# Patient Record
Sex: Female | Born: 1940 | Race: Black or African American | Hispanic: No | Smoking: Never smoker
Health system: Southern US, Community
[De-identification: ages and names within clinical notes are randomized; demographics above are authoritative.]

## PROBLEM LIST (undated history)

## (undated) DIAGNOSIS — J449 Chronic obstructive pulmonary disease, unspecified: Secondary | ICD-10-CM

## (undated) DIAGNOSIS — E119 Type 2 diabetes mellitus without complications: Secondary | ICD-10-CM

## (undated) DIAGNOSIS — J45909 Unspecified asthma, uncomplicated: Secondary | ICD-10-CM

## (undated) DIAGNOSIS — I509 Heart failure, unspecified: Secondary | ICD-10-CM

## (undated) DIAGNOSIS — K56609 Unspecified intestinal obstruction, unspecified as to partial versus complete obstruction: Secondary | ICD-10-CM

## (undated) DIAGNOSIS — K5792 Diverticulitis of intestine, part unspecified, without perforation or abscess without bleeding: Secondary | ICD-10-CM

## (undated) DIAGNOSIS — D649 Anemia, unspecified: Secondary | ICD-10-CM

## (undated) HISTORY — PX: CHOLECYSTECTOMY: SHX55

## (undated) HISTORY — PX: ABDOMINAL HYSTERECTOMY: SHX81

---

## 1999-04-26 ENCOUNTER — Emergency Department (HOSPITAL_COMMUNITY): Admission: EM | Admit: 1999-04-26 | Discharge: 1999-04-26 | Payer: Self-pay | Admitting: Emergency Medicine

## 2006-05-10 ENCOUNTER — Emergency Department (HOSPITAL_COMMUNITY): Admission: EM | Admit: 2006-05-10 | Discharge: 2006-05-10 | Payer: Self-pay | Admitting: Emergency Medicine

## 2006-05-28 ENCOUNTER — Emergency Department (HOSPITAL_COMMUNITY): Admission: EM | Admit: 2006-05-28 | Discharge: 2006-05-28 | Payer: Self-pay | Admitting: Emergency Medicine
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
## 2011-07-11 ENCOUNTER — Emergency Department (HOSPITAL_COMMUNITY): Payer: Medicare (Managed Care)

## 2011-07-11 ENCOUNTER — Inpatient Hospital Stay (INDEPENDENT_AMBULATORY_CARE_PROVIDER_SITE_OTHER)
Admission: RE | Admit: 2011-07-11 | Discharge: 2011-07-11 | Disposition: A | Discharge status: Home / Self Care | Payer: Medicare (Managed Care) | Source: Ambulatory Visit | Attending: Emergency Medicine | Admitting: Emergency Medicine

## 2011-07-11 ENCOUNTER — Inpatient Hospital Stay (HOSPITAL_COMMUNITY)
Admission: EM | Admit: 2011-07-11 | Discharge: 2011-07-13 | DRG: 287 | Disposition: A | Discharge status: Home / Self Care | Payer: Medicare (Managed Care) | Attending: Family Medicine | Admitting: Family Medicine

## 2011-07-11 DIAGNOSIS — I200 Unstable angina: Principal | ICD-10-CM | POA: Diagnosis present

## 2011-07-11 DIAGNOSIS — I509 Heart failure, unspecified: Secondary | ICD-10-CM | POA: Diagnosis present

## 2011-07-11 DIAGNOSIS — K219 Gastro-esophageal reflux disease without esophagitis: Secondary | ICD-10-CM | POA: Diagnosis present

## 2011-07-11 DIAGNOSIS — R0989 Other specified symptoms and signs involving the circulatory and respiratory systems: Secondary | ICD-10-CM

## 2011-07-11 DIAGNOSIS — Z79899 Other long term (current) drug therapy: Secondary | ICD-10-CM

## 2011-07-11 DIAGNOSIS — I10 Essential (primary) hypertension: Secondary | ICD-10-CM

## 2011-07-11 DIAGNOSIS — E119 Type 2 diabetes mellitus without complications: Secondary | ICD-10-CM | POA: Diagnosis present

## 2011-07-11 DIAGNOSIS — Z7982 Long term (current) use of aspirin: Secondary | ICD-10-CM

## 2011-07-11 DIAGNOSIS — I4891 Unspecified atrial fibrillation: Secondary | ICD-10-CM | POA: Diagnosis present

## 2011-07-11 DIAGNOSIS — R0609 Other forms of dyspnea: Secondary | ICD-10-CM

## 2011-07-11 DIAGNOSIS — I252 Old myocardial infarction: Secondary | ICD-10-CM

## 2011-07-11 DIAGNOSIS — I5032 Chronic diastolic (congestive) heart failure: Secondary | ICD-10-CM | POA: Diagnosis present

## 2011-07-11 DIAGNOSIS — J45909 Unspecified asthma, uncomplicated: Secondary | ICD-10-CM

## 2011-07-11 DIAGNOSIS — I498 Other specified cardiac arrhythmias: Secondary | ICD-10-CM | POA: Diagnosis not present

## 2011-07-11 DIAGNOSIS — D649 Anemia, unspecified: Secondary | ICD-10-CM | POA: Diagnosis present

## 2011-07-11 DIAGNOSIS — R079 Chest pain, unspecified: Secondary | ICD-10-CM

## 2011-07-11 DIAGNOSIS — R0789 Other chest pain: Secondary | ICD-10-CM

## 2011-07-11 LAB — CBC
HCT: 33.3 % — ABNORMAL LOW (ref 36.0–46.0)
Hemoglobin: 10.8 g/dL — ABNORMAL LOW (ref 12.0–15.0)
MCH: 24.2 pg — ABNORMAL LOW (ref 26.0–34.0)
MCV: 74.7 fL — ABNORMAL LOW (ref 78.0–100.0)
Platelets: 213 10*3/uL (ref 150–400)
RBC: 4.46 MIL/uL (ref 3.87–5.11)
WBC: 5.9 10*3/uL (ref 4.0–10.5)

## 2011-07-11 LAB — BASIC METABOLIC PANEL
BUN: 9 mg/dL (ref 6–23)
Potassium: 3.7 mEq/L (ref 3.5–5.1)
Sodium: 143 mEq/L (ref 135–145)

## 2011-07-11 LAB — DIFFERENTIAL
Eosinophils Absolute: 0.1 10*3/uL (ref 0.0–0.7)
Lymphs Abs: 2.3 10*3/uL (ref 0.7–4.0)
Monocytes Absolute: 0.4 10*3/uL (ref 0.1–1.0)
Neutro Abs: 3.1 10*3/uL (ref 1.7–7.7)

## 2011-07-11 LAB — GLUCOSE, CAPILLARY: Glucose-Capillary: 166 mg/dL — ABNORMAL HIGH (ref 70–99)

## 2011-07-11 LAB — POCT I-STAT TROPONIN I

## 2011-07-11 NOTE — H&P (Signed)
Family Medicine Teaching [facility] Admission History and Physical  Patient name: Dominique Hurst Medical record number: [id number] Date of birth: 07/16/1941 Age: 70 y.o. Gender: female  Primary Care Provider: No primary provider on file.  Chief Complaint: Chest tightness and SOB History of Present Illness: Dominique Hurst is a 70 y.o. year old female presenting with 2 day history of SOB and chest tightness. Pt has been in [place] for 2 weeks visiting family. Pt has experienced SOB and chest tightness whenever going upstairs to the second floor while here. Symptoms usually resolve within minutes and never include CP, HA, and diaphoresis. Onset of SOB and chest tightness for the past two days has been fairly persistent and has not been relieved entirely by rest. The pt has tried SL nitro w/ some relief of symptoms. The tightness is primarily substernal with radiation to the neck. Denies any history of heart burn or reflux.  Pt regularly seen by cardiologist in [place]. She had a cardiac catheterization performed within the last few years, but no stenting or angioplasty was performed. Pt has been managed medically. Pt initially prescribed Pradaxa but did not tolerate due to GI symptoms and bleeding. Her last appointment with her cardiologist was two months ago.   ED Course: Pt was seen in the ED and given SL nitro and placed on oxygen w/ some relief of symptoms. EKG showed NSR, L axis deviation, and non-specific T-wave abnormality. CXR showed were done which showed an enlarged heart with vascular congestion and basilar atelectasis. No definite CHF, pneumonia, collapse, consolidation, effusion or pneumothorax. Troponin in the ED was normal.    Past Medical History: Asthma HTN CAD Diverticulitis DM (non-insulin dependent) Anemia  Past Surgical History: Hysterectomy Hernia Repair Cholecystectomy  Social History: Lives at home with granddaughter. 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 Does have some dizziness when going from lying to standing, and does have occasional HA. Otherwise 12 point review of systems was performed and was unremarkable.  Physical Exam: Pulse: 76  Blood Pressure: 129/70 RR: 19   O2: 98 on RA Temp: 98.2  General: alert, cooperative and appears stated age HEENT: extra ocular movement intact, neck supple with midline trachea, thyroid without masses and trachea midline Heart: S1, S2 normal, no murmur, rub or gallop, regular rate and rhythm Lungs: clear to auscultation, no wheezes or rales and unlabored breathing Abdomen: abdomen is soft without significant tenderness, masses, organomegaly or guarding Extremities: extremities normal, atraumatic, no cyanosis or edema Musculoskeletal: no joint tenderness, deformity or swelling, no muscular tenderness noted Skin:no rashes, no ecchymoses Neurology: normal without focal findings and mental status, speech normal, alert and oriented x3  Labs and Imaging: Lab Results  Component Value Date/Time   NA 143 07/11/2011  3:30 PM   K 3.7 07/11/2011  3:30 PM   CL 103 07/11/2011  3:30 PM   CO2 31 07/11/2011  3:30 PM   BUN 9 07/11/2011  3:30 PM   CREATININE 0.60 07/11/2011  3:30 PM   GLUCOSE 100* 07/11/2011  3:30 PM   Lab Results  Component Value Date   WBC 5.9 07/11/2011   HGB 10.8* 07/11/2011   HCT 33.3* 07/11/2011   MCV 74.7* 07/11/2011   PLT 213 07/11/2011   Troponin 0.00     Assessment and Plan: Dominique Hurst is a 70 y.o. year old female presenting w/  chest tightness and SOB w/ h/o CAD, HTN, DM 1. CV: Possible MI but unlikely at this point due to normal CE and EKG. We will cycle CE x2 every 6 hours, place pt on telemetry overnight, repeat EKG in the morning. Symptoms improved after SL nitro and O2. Morphine 1mg  Q4hrs for pain, ASA 81, SL nitro, and low dose coreg, and O2 as needed for tonight. We will reavaluate in the morning. We will consider a cardiology consult +/- if condition worsens overnight. Will discuss possible cath for pt due to h/o CAD, risk factors, and current presentation. We will continue home medications for her HTN.  2.   DM type II: Well controlled at home per pt. Glucose 100 at time of admission. Will hold home medications of glipizide and and metformin due to possibility of being NPO for cath and contrast if indicated. Will control on SSI. 3. FEN/GI: Regular heart healthy diet. SLIV 4. Prophylaxis: SCDs for DVT prophylaxis due to GI bleed while on pradaxa 5. Disposition: Pending further workup and clinical improvement

## 2011-07-12 DIAGNOSIS — R0602 Shortness of breath: Secondary | ICD-10-CM

## 2011-07-12 DIAGNOSIS — R079 Chest pain, unspecified: Secondary | ICD-10-CM

## 2011-07-12 LAB — PHOSPHORUS: Phosphorus: 4.2 mg/dL (ref 2.3–4.6)

## 2011-07-12 LAB — COMPREHENSIVE METABOLIC PANEL
AST: 23 U/L (ref 0–37)
Alkaline Phosphatase: 72 U/L (ref 39–117)
Calcium: 10 mg/dL (ref 8.4–10.5)
GFR calc Af Amer: >60 mL/min (ref >60)
Glucose, Bld: 98 mg/dL (ref 70–99)
Potassium: 3.7 mEq/L (ref 3.5–5.1)
Sodium: 141 mEq/L (ref 135–145)
Total Protein: 7 g/dL (ref 6.0–8.3)

## 2011-07-12 LAB — CBC
HCT: 33.1 % — ABNORMAL LOW (ref 36.0–46.0)
MCH: 23.8 pg — ABNORMAL LOW (ref 26.0–34.0)
MCV: 75.1 fL — ABNORMAL LOW (ref 78.0–100.0)
Platelets: 208 10*3/uL (ref 150–400)
RBC: 4.41 MIL/uL (ref 3.87–5.11)

## 2011-07-12 LAB — PROTIME-INR
INR: 0.91 (ref 0.00–1.49)
Prothrombin Time: 12.4 seconds (ref 11.6–15.2)

## 2011-07-12 LAB — GLUCOSE, CAPILLARY
Glucose-Capillary: 140 mg/dL — ABNORMAL HIGH (ref 70–99)
Glucose-Capillary: 177 mg/dL — ABNORMAL HIGH (ref 70–99)
Glucose-Capillary: 157 mg/dL — ABNORMAL HIGH (ref 70–99)

## 2011-07-12 LAB — CARDIAC PANEL(CRET KIN+CKTOT+MB+TROPI)
Relative Index: 2.1 (ref 0.0–2.5)
Total CK: 198 U/L — ABNORMAL HIGH (ref 7–177)

## 2011-07-12 LAB — MAGNESIUM: Magnesium: 2 mg/dL (ref 1.5–2.5)

## 2011-07-12 LAB — HEMOGLOBIN A1C: Mean Plasma Glucose: 169 mg/dL — ABNORMAL HIGH (ref <117)

## 2011-07-13 DIAGNOSIS — I498 Other specified cardiac arrhythmias: Secondary | ICD-10-CM

## 2011-07-13 LAB — GLUCOSE, CAPILLARY
Glucose-Capillary: 126 mg/dL — ABNORMAL HIGH (ref 70–99)
Glucose-Capillary: 105 mg/dL — ABNORMAL HIGH (ref 70–99)
Glucose-Capillary: 86 mg/dL (ref 70–99)
Glucose-Capillary: 209 mg/dL — ABNORMAL HIGH (ref 70–99)

## 2011-07-13 LAB — LIPID PANEL
Cholesterol: 167 mg/dL (ref 0–200)
Total CHOL/HDL Ratio: 2.6 RATIO

## 2011-07-13 LAB — VITAMIN B12: Vitamin B-12: 756 pg/mL (ref 211–911)

## 2011-07-13 LAB — IRON AND TIBC: TIBC: 302 ug/dL (ref 250–470)

## 2011-07-13 LAB — FERRITIN: Ferritin: 17 ng/mL (ref 10–291)

## 2011-07-13 NOTE — Consult Note (Addendum)
NAMEMarland Hurst  Dominique, Hurst NO.:  [id number]  MEDICAL RECORD NO.:  [id number]  LOCATION:  2002                         FACILITY:  [facility]  PHYSICIAN:  Vesta Mixer, M.D. DATE OF BIRTH:  [birth year]  DATE OF CONSULTATION:  07/12/2011 DATE OF DISCHARGE:                                CONSULTATION   PRIMARY CARDIOLOGIST:  Dr. Kelby Aline,  [place], [place].  CHIEF COMPLAINT:  Shortness of breath.  REASON FOR CONSULTATION:  Shortness of breath, chest pressure, and pauses.  HISTORY OF PRESENT ILLNESS:  Dominique Hurst is a 70 year old pleasant female who is followed in [place], [place] by Dr. Gordan Payment, cardiologist there. She states she has a history of coronary artery disease with an MI in 1991, but at that time required no PCI and catheterization and states the catheterization 1 year ago revealed no blockages.  She also has a history of paroxysmal atrial fibrillation and is on no blood thinning medications secondary to a lower GI bleed 2 months ago.  She also reports history of CHF.  Complete details of her medical history are not available at this time.  She is visiting family here in [place], and stated she began feeling poorly on Sunday and Monday where she could not breathe properly.  She has had intermittent sensation, coming and going as if she has not been able to breathe fast or slow.  Her shortness of breath became worse on stairs and alleviate in any amount of exertion, relieved by stopping completely and resting.  She states this is different than her usual asthma symptoms.  She has also had this discomfort intermittently at rest.  She has had no frank chest pain, but describes a heaviness like something is holding her chest.  She has had occasional wheezing, but not in conjunction with her symptoms.  She took a nitroglycerin at home, which made the pain better and received one here in the hospital as well, which also relieved the pain.  She is not currently  having any symptoms.  CK and MB are elevated, but negative relative index and negative troponin are noted.  She has also had occasional pauses overnight, approximately 1 and 4 a.m. she has had about 12.4-second pause as well as 3.4-second pause at 5:13 a.m.  Nursing documentation states that she was in sleep each time and asymptomatic with stable blood pressures.  The patient, however, states she is occasionally able to feel her heart slow down and felt this twice, but is unable to pinpoint at times.  PAST MEDICAL HISTORY: 1. Questionable CAD.  The patient had an MI in 1991, but had no stent     or intervention.  She is unsure of the cath report at that time.     She states she had a cath 1 year ago showing no blockages. 2. Reported history of CHF. 3. Atrial fibrillation.  The patient was previously on Pradaxa, but     this was discontinued secondary to a GI bleed 2 months ago.  She     has not required cardioversion and has been fine this admission. 4. Hypertension. 5. Asthma. 6. Diverticulitis. 7. Non-insulin dependent diabetes mellitus secondary to steroid  therapy for years for asthma. 8. Anemia with history of lower GI bleed 2 months ago. 9. Syncopal event in May 2011.  Details of this are unavailable and     the patient is not quite sure what caused it, but states she had a     full workup.  She was told it was related to decreased oxygen     saturations.  SURGICAL HISTORY: 1. Colostomy and reversal in the late 1980s secondary to     diverticulosis. 2. Hysterectomy. 3. Hernia repairs. 4. Cholecystectomy. 5. Benign breast lumpectomy.  MEDICATIONS: 1. Aspirin 81 mg daily. 2. Sliding scale insulin. 3. Cozaar 50 mg daily. 4. Procardia 30 mg daily.  At home, she is also on metformin, glipizide, and vitamin D.  No beta blockade.  ALLERGIES:  ACE INHIBITORS causing angioedema.  SOCIAL HISTORY:  Dominique Hurst lives with her granddaughter in [place]. She is here visiting  family.  She denies any tobacco or alcohol use. She is a retired Agricultural engineer.  FAMILY HISTORY:  Negative for coronary artery disease.  REVIEW OF SYSTEMS:  See HPI for pertinent positives.  All other systems reviewed and otherwise negative.  LABORATORY DATA:  WBC 5, hemoglobin 10.5, hematocrit 33.1, platelet count 208.  Sodium 141, potassium 3.7 chloride 104, CO2 of 29, glucose 98, BUN 11, creatinine 0.61.  LFTs were okay with the exception of albumin 3.4.  CKs and MBs have been mildly elevated.  Peak CK of 209, peak MB of 4.4.  Negative troponin.  Magnesium 2.0.  RADIOLOGIC STUDIES:  Chest x-ray showed cardiomegaly without CHF or pneumonia.  EKG, normal sinus rhythm with right bundle-branch block 67 beats per minute with nonspecific ST-T changes and ST depression V4 through V6 laterally.  PHYSICAL EXAMINATION:  VITAL SIGNS:  Temperature 97.8, pulse 58, respirations 20, blood pressure 123/65, pulse ox 98% on room air. GENERAL:  This is a pleasant elderly African American female in no acute distress. HEENT:  Normocephalic and atraumatic.  Extraocular movements intact. Clear sclerae.  Nares are without discharge. NECK:  Supple without carotid bruit or JVD. HEART:  Auscultation to the heart reveals regular rate and rhythm with S1 and S2 without murmurs, rubs, or gallops. LUNGS:  Clear to auscultation bilaterally without wheezes, rales, or rhonchi. ABDOMEN:  Soft, nontender, nondistended with positive bowel sounds. EXTREMITIES:  Warm and dry with trace lower extremity edema bilaterally. 2+ pedal pulses. NEUROLOGIC:  She is alert and oriented x3, responds to questions appropriately with a normal affect.  ASSESSMENT/PLAN:  The patient was seen and examined by Dr. Elease Hashimoto and myself.  This is a 70 year old female with a history of myocardial infarction in 1991 with catheterization less than 1 year ago with no blockages per patient report, and the patient reported congestive  heart failure and atrial fibrillation who presents to [facility] while visiting family with complaints of chest pressure, shortness of breath.  Her symptoms at this time are relatively typical, aggravated with exertion, relieved with rest, and also relieved with nitroglycerin. Troponins have remained negative x3 and the patient is feeling better at this time.  We would recommend catheterization to define her coronary anatomy given concerns for unstable angina.  In regards to her pauses, typically we would not be extremely concerned with nocturnal bradycardia, but the patient does have a history of syncope in May 2011. This will need to be clarified further by her records.  TSH is also in progress.  We will ask EP to provide their thoughts  after her catheterization.     Ronie Spies, P.A.C.   ______________________________ Vesta Mixer, M.D.    DD/MEDQ  D:  07/12/2011  T:  07/12/2011  Job:  [id number]  cc:   Dr. Gordan Payment  Electronically Signed by Ronie Spies  on 07/13/2011 06:44:32 AM Electronically Signed by Kristeen Miss M.D. on 07/30/2011 07:43:40 PM

## 2011-07-16 NOTE — Cardiovascular Report (Signed)
  NAME:  Dominique Hurst, SEVILLANO NO.:  [id number]  MEDICAL RECORD NO.:  [id number]  LOCATION:  2002                         FACILITY:  [facility]  PHYSICIAN:  Peter M. Swaziland, M.D.  DATE OF BIRTH:  [birth year]  DATE OF PROCEDURE: DATE OF DISCHARGE:                           CARDIAC CATHETERIZATION   INDICATION FOR PROCEDURE:  The patient is a 70 year old African American female who presents with symptoms of chest pain relieved with nitroglycerin.  She has multiple cardiac risk factors.  She has also had a history of syncope and has been noted to have some sinus pauses.  PROCEDURE:  Left heart catheterization, coronary and left ventricular angiography.  ACCESS:  Via the right radial artery using standard Seldinger technique.  EQUIPMENT:  A 5-French 4 cm right Judkins catheter, 5-French 3.5-cm left Judkins catheter, 5-French pigtail catheter, 5-French arterial sheath.  MEDICATIONS: 1. Local anesthesia with 1% Xylocaine. 2. Versed 2 mg IV. 3. Fentanyl 25 mcg IV. 4. Verapamil 3 mg intra-arterial. 5. Heparin 5000 units IV.  CONTRAST:  80 mL of Omnipaque.  HEMODYNAMIC DATA:  Aortic pressure is 125/56 with a mean of 84 mmHg, left ventricle pressure is 131 with EDP of 10 mmHg.  ANGIOGRAPHIC DATA:  There was some difficulty with catheter engagement due to tortuosity of the innominate vessel.  However, we were able to successfully complete the procedure.  The left coronary artery arises and distributes normally.  The left main coronary artery is normal.  The left anterior descending artery is normal.  The left circumflex coronary is normal.  The right coronary arises slightly anteriorly.  There is calcification at the aorto-ostium.  Otherwise the right coronary is normal throughout.  Left ventricular angiography was performed in the RAO view.  This demonstrates normal left ventricular size and contractility with normal systolic function.  Ejection fraction is estimated  at 60%.  FINAL INTERPRETATION: 1. Normal coronary anatomy. 2. Normal left ventricular function.          ______________________________ Peter M. Swaziland, M.D.     PMJ/MEDQ  D:  07/13/2011  T:  07/13/2011  Job:  [id number]  cc:   Vesta Mixer, M.D. Leighton Roach McDiarmid, M.D.  Electronically Signed by PETER Swaziland M.D. on 07/16/2011 02:15:52 PM

## 2011-07-25 NOTE — Discharge Summary (Signed)
NAMEMarland Kitchen  Hurst, Dominique                ACCOUNT NO.:  [id number]  MEDICAL RECORD NO.:  [id number]  LOCATION:  2002                         FACILITY:  [facility]  PHYSICIAN:  Dominique Roach Avalina Benko, M.D.DATE OF BIRTH:  12-Jun-1941  DATE OF ADMISSION:  07/11/2011 DATE OF DISCHARGE:  07/13/2011                              DISCHARGE SUMMARY   PRIMARY CARE PHYSICIAN:  Dr. Glori Bickers.  CONSULTANTS DURING THIS HOSPITALIZATION:  Cardiology, Dr. Deloris Ping. Nahser.  PROCEDURES AND RESULTS: 1. Cardiac catheterization on July 13, 2011:  Normal coronary artery     anatomy.  Normal left ventricular function.  Ejection fraction 60%. 2. EP study:  Normal.  REASON FOR ADMISSION:  Chest tightness and shortness of breath.  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  DISCHARGE MEDICATIONS: 1. Aspirin 81 mg by mouth daily. 2. Nitroglycerin 0.4 mg sublingual tablet, 1 tablet sublingually every     5 minutes as needed up to 3 doses for chest pain. 3. Omeprazole 20 mg 1 tablet by mouth daily for 4 weeks. 4. Advair 100/50, 2 puffs inhaled b.i.d. 5. Docusate 1 capsule by mouth p.r.n. constipation. 6. Glipizide XL 10 mg 1 tablet by mouth b.i.d. 7. Losartan 50 mg 1 tablet by mouth daily. 8. Metformin 500 mg 1 tablet by mouth b.i.d. 9. Nifedipine XR 30 mg 1 tablet by mouth daily. 10.Metamucil once by mouth daily p.r.n. 11.Tylenol 1-2 tablets by mouth q.6 h. p.r.n. pain. 12.Albuterol 1-2 puffs inhaled every 4 hours as needed p.r.n. 13.Vitamin D 50,000 units 1 tablet by mouth q. weekly.  HOSPITAL COURSE: 1. Cardiovascular:  The patient presented to the emergency room with 2-     day history of persistent chest tightness and shortness of breath     that was only mildly relieved with sublingual nitro tablets at     home.  The patient had had recent exacerbations over the prior     weeks of chest tightness and shortness of breath after ascending 1     flight of steps in her family  members home which was relieved by     rest.  Upon presentation in the emergency room, an EKG and cardiac     enzymes were taken which were found to be normal.  The patient was     given sublingual nitro and oxygen with improvement of symptoms.     Due to the patient's age and other risk factors, the patient was     admitted to the Hospital Service for chest pain, myocardial     ischemia rule out.  Cardiac enzymes were cycled x2 and were all     found to be negative.  Repeat EKG was also unconcerning for     myocardial ischemia.  Cardiology felt due to past medical history     of coronary artery disease, prior catheterization, and unstable     angina that the patient needed to have a cardiac catheterization     which was performed on August 24 and was found to be normal as     stated above.  The patient also received electrophysiology study on     August 24 which was also found to  be normal.  Overnight on the     night of the 22nd, the patient had 2-3 episodes of pneumonitis.  On     the night of 22nd, the patient did experience a few episodes of     nocturnal bradycardia with pauses up to 2.9 seconds that Cardiology     did not feel was concerning due to the patient's history and other     test results.  Cardiology felt the patient was stable to be able to     go home with followup with her cardiologist in [place], [place],     Dr. Caprice Renshaw upon her return to [place] the following week. 2. Diabetes type 2.  The patient's home medications of glipizide and     metformin were held at time of admission due to uncertainty of     needing to be n.p.o. and also receiving possible contrast for     imaging studies.  The patient was managed in the hospital on a     sensitive sliding scale without any problems.  A1c was obtained     during hospitalization and was found to be 7.5.  The patient was     discharged home on home regimen of glipizide and metformin. 3. Asthma.  The patient was admitted on 2  liters of oxygen mostly due     to symptoms of being short of breath felt to be cardiac in nature.     The patient was weaned off of her oxygen during her admission and     was on room air the day prior and at time of discharge from the     hospital.  The patient was restarted on her Advair every day and     was doing well at time of discharge. 4. Anemia.  The patient was found to be anemic during hospitalization     which was microcytic in nature and felt to be due to a low iron.     The patient was counseled to follow up with her primary care     physician back in [place] at time of return to [place].  No     further workup. 5. GERD.  The patient had prior history of GERD and treatment of     symptoms and was started on omeprazole 20 mg for a 4 week trial at     time of discharge as it was felt the patient might find some relief     of chest discomfort in treating acid reflux.  DISPOSITION AT TIME OF DISCHARGE:  Good.  DISPOSITION:  Home with family members with travel back to her home in [place] mid next week.  DISCHARGE FOLLOWUP:  Dr. Glori Bickers at [place], [place] on first week of September 2012, phone number [phone].  FOLLOWUP ISSUES:  Thorough cardiac exam and anemia.  If you have any questions, please page me at [phone].    ______________________________ Dominique Flatten, MD   ______________________________ Dominique Hurst, M.D.    DM/MEDQ  D:  07/14/2011  T:  07/14/2011  Job:  [id number]  Electronically Signed by Dominique Flatten MD on 07/17/2011 09:02:04 PM Electronically Signed by Dominique Hurst M.D. on 07/25/2011 08:08:56 AM

## 2011-07-25 NOTE — H&P (Signed)
NAMEMarland Kitchen  RENU, ASBY NO.:  [id number]  MEDICAL RECORD NO.:  [id number]  LOCATION:  2002                         FACILITY:  [facility]  PHYSICIAN:  Nestor Ramp, MD        DATE OF BIRTH:  Dec 15, 1940  DATE OF ADMISSION:  07/11/2011 DATE OF DISCHARGE:                             HISTORY & PHYSICAL   PRIMARY CARE PHYSICIAN:  Aliene Altes, MD, [place], [place]  CARDIOLOGIST:  Royce Macadamia, MD, [place], [place]  CHIEF COMPLAINT:  Chest tightness and shortness of breath.  HISTORY OF PRESENT ILLNESS:  Dominique Hurst is a 70 year old African American female presenting with 2-day history of shortness of breath and chest tightness.  The patient has been in [place] for 2 weeks visiting family.  The patient has experienced shortness of breath and chest tightness whenever going upstairs to the second floor while here in [place].  Symptoms usually resolve within minutes and never include chest pain, headache, and diaphoresis.  Onset of shortness of breath and chest tightness for the past 2 days has been fairly persistent and has not been relieved entirely by rest.  The patient has tried sublingual nitro with some relief of symptoms.  Chest tightness is primarily substernal with radiation to the neck.  Denies any history of heart burn or reflux.  The patient is regularly seen by a cardiologist in [place], Dr. Kelby Aline. She had a cardiac catheterization last year but no stenting or angioplasty was performed.  The patient has been managed medically since that time.  The patient initially prescribed Pradaxa but did not tolerate due to GI symptoms and bleeding.  Her last appointment with her cardiologist was 2 months ago.  EMERGENCY DEPARTMENT COURSE:  The patient was seen in the ED and given sublingual nitro and placed on oxygen with some relief of symptoms.  EKG showed normal sinus rhythm, left axis deviation, and nonspecific T-wave abnormality, possible right  bundle-branch block.  Chest x-ray showed an enlarged heart with vascular congestion and basilar atelectasis.  No definite CHF, pneumonia, prevascular collapse, consolidation, effusion, or pneumothorax.  Troponin in the ED was normal.  PAST MEDICAL HISTORY: 1. Asthma. 2. Hypertension. 3. Coronary artery disease. 4. Diverticulitis. 5. Diabetes, non-insulin-dependent. 6. Anemia.  PAST SURGICAL HISTORY: 1. Hysterectomy. 2. Hernia. 3. Cholecystectomy.  SOCIAL HISTORY: 1. Lives at home with granddaughter, retired Administrator, sports. 2. Tobacco:  Never. 3. Alcohol:  Denied. 4. Drugs:  Denied.  FAMILY HISTORY:  Noncontributory.  ALLERGIES:  ACE INHIBITORS cause angioedema.  MEDICATIONS: 1. Colace 100 mg p.o. daily p.r.n. 2. Glipizide XL 10 mg p.o. daily. 3. Metformin 500 mg p.o. daily. 4. Nifedipine 30 mg p.o. daily. 5. Psyllium. 6. Tylenol 350 mg p.o. p.r.n. 7. Vitamin D 5000 International Units p.o. per week. 8. Aspirin 81 mg p.o. daily. 9. Albuterol inhaler 2 puffs as needed. 10.Advair 150 two puffs b.i.d. REVIEW OF SYSTEMS:  Per HPI with the following additions:  Denies fever, chills, syncope, weight loss, change in vision, nausea, vomiting, dysuria, and hematuria.  Does have some dizziness when going from lying to standing and does have occasional headache.  PHYSICAL EXAMINATION:  VITAL SIGNS:  Pulse 76, blood pressure  129/70, respiratory rate 19, O2 sats 98% on room air on 2 L nasal cannula, and temperature 98.2. GENERAL:  Alert, cooperative, and appears stated age. HEENT:  Extraocular movement intact. NECK:  Supple with midline trachea.  Thyroid without masses. HEART:  Regular rate and rhythm.  No murmurs, rubs, or gallops. LUNGS:  Clear to auscultation bilaterally.  Normal effort. ABDOMEN:  Normoactive bowel sounds.  Abdomen was soft, nontender, and no organomegaly. EXTREMITIES:  Normal, atraumatic.  No cyanosis or edema. MUSCULOSKELETAL:  No joint tenderness,  deformity, or swelling.  No muscular tenderness noted. SKIN:  No rashes or ecchymoses. NEUROLOGIC:  Normal without focal findings in mental status.  Speech normal.  Alert and oriented x3.  Cranial nerves grossly intact.  LABORATORY DATA AND IMAGING:  Sodium 143, potassium 3.7, chloride 103, CO2 31, BUN 9, creatinine 0.6, and glucose 100.  White blood cell 5.9, hemoglobin 10.8, hematocrit 33.3, MCV 74.7, and platelets 213. Troponins 0.00.  ASSESSMENT AND PLAN:  Dominique Hurst is a 70 year old female presenting with chest tightness and shortness of breath with a history of coronary artery disease, hypertension, and diabetes mellitus. 1. Cardiovascular:  Possible myocardial infarction, but unlikely at     this point due to normal cardiac enzymes and EKG.  We will cycle     cardiac enzymes x2 every 6 hours and place the patient on telemetry     overnight with repeat EKG in the morning.  Symptoms improved after     sublingual nitro and O2.  Morphine 1 mg q.4 h. for pain, aspirin     81, sublingual nitro, and O2 as needed for tonight.  We will     reevaluate in the morning.  We will consider a Cardiology consult     plus/minus if condition worsens overnight.  We will discuss     possible catheterizations per patient due to history of coronary     artery disease, risk factors, and current presentation.  We will     continue home medications for her hypertension. 2. Diabetes type 2:  Well-controlled at home per patient.  Glucose 100     at time of admission.  We will hold home medications of glipizide     and metformin due to possibility of being n.p.o. for     catheterization and contrast if indicated.  We will control on     sliding scale insulin. 3. Fluids, electrolytes, nutrition, and gastrointestinal:  Regular     heart healthy diet.  Saline lock IV. 4. Prophylaxis:  Sequential compression devices for deep venous     thrombosis prophylaxis due to gastrointestinal bleed while on      Pradaxa. 5. Disposition:  Pending further workup and clinical improvement.    ______________________________ Shelly Flatten, MD   ______________________________ Nestor Ramp, MD    DM/MEDQ  D:  07/12/2011  T:  07/12/2011  Job:  [id number]  Electronically Signed by Shelly Flatten MD on 07/24/2011 04:26:05 PM Electronically Signed by Denny Levy MD on 07/25/2011 03:08:55 PM

## 2011-08-30 NOTE — Consult Note (Signed)
  NAMEMarland Hurst  Dominique, Hurst NO.:  [id number]  MEDICAL RECORD NO.:  [id number]  LOCATION:  2002                         FACILITY:  [facility]  PHYSICIAN:  Doylene Canning. Ladona Ridgel, MD    DATE OF BIRTH:  August 25, 1941  DATE OF CONSULTATION:  07/13/2011 DATE OF DISCHARGE:  07/13/2011                                CONSULTATION   REQUESTING PHYSICIANS:  Vesta Mixer, MD and Dr. [place].  INDICATION FOR CONSULTATION:  Evaluation of bradycardia with a history of syncope.  HISTORY OF PRESENT ILLNESS:  The patient is a very pleasant 70 year old woman who has a longstanding history of hypertension and worsening dyspnea and chest pressure.  She was admitted to the hospital with the above symptoms and underwent catheterization demonstrating no coronary artery disease and preserved LV function.  She had asymptomatic nocturnal pauses of up to 3.5 seconds.  She had sinus bradycardia also nocturnal with heart rates down into the high 20s and low 30s.  While she was awake, however, she had no symptomatic bradycardia.  The patient does have chest pressure and I suspect this is a multifactorial problem. Her most recent episode of syncope occurred approximately 17 months ago. She has had no episodes since then.  She denies palpitations.  PAST MEDICAL HISTORY:  Notable for: 1. Asthma. 2. Hypertension. 3. Diverticulitis. 4. Non-insulin-dependent diabetes.  SOCIAL HISTORY:  The patient lives in [place] with her granddaughter. She denies tobacco or ethanol abuse.  FAMILY HISTORY:  Negative for premature coronary artery disease.  REVIEW OF SYSTEMS:  Negative except as noted in the HPI.  ALLERGIES:  ACE INHIBITORS, for which she has angioedema.  PHYSICAL EXAMINATION:  GENERAL:  She is a pleasant, well-appearing, obese 70 year old woman in no acute distress. VITAL SIGNS:  Her blood pressure was 150/80, the pulse 80 and regular, respirations 18, and temperature 98. HEENT:  Normocephalic and  atraumatic.  Pupils equal and round. Oropharynx moist.  Sclerae anicteric. NECK:  No jugular venous distention. LUNGS:  Clear bilaterally to auscultation.  No wheezes, rales, or rhonchi are present. CARDIAC:  Regular rate and rhythm.  Normal S1 and S2.  PMI was not enlarged nor laterally displaced. ABDOMEN:  Obese, nontender, and nondistended.  No organomegaly. EXTREMITIES:  No cyanosis, clubbing, or edema.  Pulses are 2+ and symmetric. NEUROLOGIC:  Alert and oriented x3.  Cranial nerves intact.  Strength is 5/5 and symmetric.  EKG demonstrates sinus bradycardia.  IMPRESSION: 1. Asymptomatic nocturnal bradycardia. 2. Hypertension. 3. Chest pain, noncardiac. 4. Remote history of syncope. 5. Diastolic heart failure.  DISCUSSION:  The patient has no indication for pacemaker at this time. Her nocturnal bradycardia is asymptomatic.  I do not think her remote syncopal episode is related to her current problem.  I have encouraged her to increase her physical activity and avoid medications that slow down her sinus node.  From my perspective, discharge would be appropriate.     Doylene Canning. Ladona Ridgel, MD     GWT/MEDQ  D:  07/13/2011  T:  07/14/2011  Job:  [id number]  Electronically Signed by Lewayne Bunting MD on 08/30/2011 06:40:17 PM

## 2016-02-29 ENCOUNTER — Emergency Department (HOSPITAL_COMMUNITY): Payer: Medicare (Managed Care)

## 2016-02-29 ENCOUNTER — Encounter (HOSPITAL_COMMUNITY): Payer: Self-pay | Admitting: *Deleted

## 2016-02-29 ENCOUNTER — Inpatient Hospital Stay (HOSPITAL_COMMUNITY)
Admission: EM | Admit: 2016-02-29 | Discharge: 2016-03-06 | DRG: 202 | Disposition: A | Discharge status: Home / Self Care | Payer: Medicare (Managed Care) | Attending: Internal Medicine | Admitting: Internal Medicine

## 2016-02-29 DIAGNOSIS — J208 Acute bronchitis due to other specified organisms: Secondary | ICD-10-CM | POA: Diagnosis present

## 2016-02-29 DIAGNOSIS — I4581 Long QT syndrome: Secondary | ICD-10-CM

## 2016-02-29 DIAGNOSIS — D509 Iron deficiency anemia, unspecified: Secondary | ICD-10-CM | POA: Diagnosis not present

## 2016-02-29 DIAGNOSIS — J45901 Unspecified asthma with (acute) exacerbation: Secondary | ICD-10-CM | POA: Diagnosis not present

## 2016-02-29 DIAGNOSIS — R059 Cough, unspecified: Secondary | ICD-10-CM

## 2016-02-29 DIAGNOSIS — J441 Chronic obstructive pulmonary disease with (acute) exacerbation: Secondary | ICD-10-CM | POA: Diagnosis present

## 2016-02-29 DIAGNOSIS — Z79899 Other long term (current) drug therapy: Secondary | ICD-10-CM

## 2016-02-29 DIAGNOSIS — Z7982 Long term (current) use of aspirin: Secondary | ICD-10-CM

## 2016-02-29 DIAGNOSIS — R739 Hyperglycemia, unspecified: Secondary | ICD-10-CM | POA: Diagnosis present

## 2016-02-29 DIAGNOSIS — T380X5A Adverse effect of glucocorticoids and synthetic analogues, initial encounter: Secondary | ICD-10-CM | POA: Diagnosis present

## 2016-02-29 DIAGNOSIS — R0600 Dyspnea, unspecified: Secondary | ICD-10-CM | POA: Diagnosis not present

## 2016-02-29 DIAGNOSIS — J449 Chronic obstructive pulmonary disease, unspecified: Secondary | ICD-10-CM | POA: Diagnosis not present

## 2016-02-29 DIAGNOSIS — E119 Type 2 diabetes mellitus without complications: Secondary | ICD-10-CM | POA: Diagnosis not present

## 2016-02-29 DIAGNOSIS — R9431 Abnormal electrocardiogram [ECG] [EKG]: Secondary | ICD-10-CM | POA: Diagnosis present

## 2016-02-29 DIAGNOSIS — R05 Cough: Secondary | ICD-10-CM

## 2016-02-29 DIAGNOSIS — I110 Hypertensive heart disease with heart failure: Secondary | ICD-10-CM | POA: Diagnosis present

## 2016-02-29 DIAGNOSIS — I2583 Coronary atherosclerosis due to lipid rich plaque: Secondary | ICD-10-CM | POA: Diagnosis present

## 2016-02-29 DIAGNOSIS — I5032 Chronic diastolic (congestive) heart failure: Secondary | ICD-10-CM | POA: Diagnosis present

## 2016-02-29 DIAGNOSIS — Z7984 Long term (current) use of oral hypoglycemic drugs: Secondary | ICD-10-CM

## 2016-02-29 DIAGNOSIS — Z7951 Long term (current) use of inhaled steroids: Secondary | ICD-10-CM

## 2016-02-29 DIAGNOSIS — I2510 Atherosclerotic heart disease of native coronary artery without angina pectoris: Secondary | ICD-10-CM | POA: Diagnosis present

## 2016-02-29 DIAGNOSIS — B9789 Other viral agents as the cause of diseases classified elsewhere: Secondary | ICD-10-CM | POA: Diagnosis present

## 2016-02-29 HISTORY — DX: Type 2 diabetes mellitus without complications: E11.9

## 2016-02-29 HISTORY — DX: Heart failure, unspecified: I50.9

## 2016-02-29 HISTORY — DX: Unspecified intestinal obstruction, unspecified as to partial versus complete obstruction: K56.609

## 2016-02-29 HISTORY — DX: Anemia, unspecified: D64.9

## 2016-02-29 HISTORY — DX: Unspecified asthma, uncomplicated: J45.909

## 2016-02-29 HISTORY — DX: Diverticulitis of intestine, part unspecified, without perforation or abscess without bleeding: K57.92

## 2016-02-29 HISTORY — DX: Chronic obstructive pulmonary disease, unspecified: J44.9

## 2016-02-29 LAB — I-STAT TROPONIN, ED: TROPONIN I, POC: 0.01 ng/mL (ref 0.00–0.08)

## 2016-02-29 LAB — BASIC METABOLIC PANEL
ANION GAP: 12 (ref 5–15)
BUN: 9 mg/dL (ref 6–20)
CALCIUM: 9.4 mg/dL (ref 8.9–10.3)
CO2: 26 mmol/L (ref 22–32)
Chloride: 104 mmol/L (ref 101–111)
Creatinine, Ser: 0.64 mg/dL (ref 0.44–1.00)
Glucose, Bld: 293 mg/dL — ABNORMAL HIGH (ref 65–99)
Potassium: 4.1 mmol/L (ref 3.5–5.1)
SODIUM: 142 mmol/L (ref 135–145)

## 2016-02-29 LAB — CBC
HCT: 34.9 % — ABNORMAL LOW (ref 36.0–46.0)
HEMOGLOBIN: 10.8 g/dL — AB (ref 12.0–15.0)
MCH: 23.6 pg — ABNORMAL LOW (ref 26.0–34.0)
MCHC: 30.9 g/dL (ref 30.0–36.0)
MCV: 76.4 fL — ABNORMAL LOW (ref 78.0–100.0)
Platelets: 180 10*3/uL (ref 150–400)
RBC: 4.57 MIL/uL (ref 3.87–5.11)
RDW: 15.4 % (ref 11.5–15.5)
WBC: 5.1 10*3/uL (ref 4.0–10.5)

## 2016-02-29 LAB — BRAIN NATRIURETIC PEPTIDE: B NATRIURETIC PEPTIDE 5: 52.9 pg/mL (ref 0.0–100.0)

## 2016-02-29 MED ORDER — IPRATROPIUM BROMIDE 0.02 % IN SOLN
1.0000 mg | Freq: Once | RESPIRATORY_TRACT | Status: AC
  Administered 2016-02-29: 1 mg via RESPIRATORY_TRACT
  Filled 2016-02-29: qty 5

## 2016-02-29 MED ORDER — KETOROLAC TROMETHAMINE 30 MG/ML IJ SOLN
15.0000 mg | Freq: Once | INTRAMUSCULAR | Status: AC
  Administered 2016-02-29: 15 mg via INTRAVENOUS
  Filled 2016-02-29: qty 1

## 2016-02-29 MED ORDER — DEXTROSE 5 % IV SOLN
500.0000 mg | Freq: Once | INTRAVENOUS | Status: AC
  Administered 2016-02-29: 500 mg via INTRAVENOUS
  Filled 2016-02-29: qty 500

## 2016-02-29 MED ORDER — ALBUTEROL (5 MG/ML) CONTINUOUS INHALATION SOLN
15.0000 mg/h | INHALATION_SOLUTION | RESPIRATORY_TRACT | Status: DC
  Administered 2016-02-29: 15 mg/h via RESPIRATORY_TRACT
  Filled 2016-02-29 (×2): qty 20

## 2016-02-29 MED ORDER — METHYLPREDNISOLONE SODIUM SUCC 125 MG IJ SOLR
125.0000 mg | Freq: Once | INTRAMUSCULAR | Status: AC
  Administered 2016-02-29: 125 mg via INTRAVENOUS
  Filled 2016-02-29: qty 2

## 2016-02-29 MED ORDER — ALBUTEROL SULFATE (2.5 MG/3ML) 0.083% IN NEBU
5.0000 mg | INHALATION_SOLUTION | Freq: Once | RESPIRATORY_TRACT | Status: AC
  Administered 2016-02-29: 5 mg via RESPIRATORY_TRACT

## 2016-02-29 MED ORDER — MAGNESIUM SULFATE 2 GM/50ML IV SOLN
2.0000 g | Freq: Once | INTRAVENOUS | Status: AC
  Administered 2016-02-29: 2 g via INTRAVENOUS
  Filled 2016-02-29: qty 50

## 2016-02-29 MED ORDER — ALBUTEROL SULFATE (2.5 MG/3ML) 0.083% IN NEBU
INHALATION_SOLUTION | RESPIRATORY_TRACT | Status: AC
  Filled 2016-02-29: qty 6

## 2016-02-29 NOTE — ED Notes (Signed)
Report attempted. Nobody answering the phone on the unit at this time.

## 2016-02-29 NOTE — ED Provider Notes (Signed)
CSN: [id number]     Arrival date & time 02/29/16  1636 History   First MD Initiated Contact with Patient 02/29/16 1825     Chief Complaint  Patient presents with  . Asthma  . Shortness of Breath     (Consider location/radiation/quality/duration/timing/severity/associated sxs/prior Treatment) Patient is a 75 y.o. female presenting with asthma and shortness of breath.  Asthma This is a new problem. The problem occurs constantly. Associated symptoms include headaches and shortness of breath. Pertinent negatives include no chest pain and no abdominal pain. Nothing aggravates the symptoms. Nothing relieves the symptoms. She has tried nothing for the symptoms. The treatment provided no relief.  Shortness of Breath Associated symptoms: headaches   Associated symptoms: no abdominal pain, no chest pain, no cough and no fever     Past Medical History  Diagnosis Date  . Asthma   . Diabetes mellitus without complication (HCC)   . CHF (congestive heart failure) (HCC)   . COPD (chronic obstructive pulmonary disease) (HCC)   . Diverticulitis   . Bowel obstruction [facility])    Past Surgical History  Procedure Laterality Date  . Cholecystectomy    . Abdominal hysterectomy     History reviewed. No pertinent family history. Social History  Substance Use Topics  . Smoking status: Never Smoker   . Smokeless tobacco: None  . Alcohol Use: No   OB History    No data available     Review of Systems  Constitutional: Negative for fever.  HENT: Negative for congestion and facial swelling.   Eyes: Negative for discharge and redness.  Respiratory: Positive for shortness of breath. Negative for cough.   Cardiovascular: Negative for chest pain.  Gastrointestinal: Negative for abdominal pain and abdominal distention.  Endocrine: Negative for polydipsia.  Genitourinary: Negative for dysuria.  Musculoskeletal: Negative for back pain.  Skin: Negative for wound.  Neurological: Positive for headaches.       Allergies  Review of patient's allergies indicates no known allergies.  Home Medications   Prior to Admission medications   Not on File   BP 130/84 mmHg  Pulse 97  Temp(Src) 99.3 F (37.4 C) (Oral)  Resp 20  Ht  (1.575 m)  Wt 172 lb (78.019 kg)  BMI 31.45 kg/m2  SpO2 100% Physical Exam  Constitutional: She is oriented to person, place, and time. She appears well-developed and well-nourished.  HENT:  Head: Normocephalic and atraumatic.  Neck: Normal range of motion.  Cardiovascular: Normal rate and regular rhythm.   Pulmonary/Chest: No accessory muscle usage or stridor. Tachypnea noted. No respiratory distress. She has decreased breath sounds. She has wheezes. She has no rhonchi. She has no rales.  Abdominal: Soft. Bowel sounds are normal. She exhibits no distension. There is no tenderness.  Musculoskeletal: Normal range of motion. She exhibits no edema or tenderness.  Neurological: She is alert and oriented to person, place, and time.  Skin: Skin is warm and dry.  Nursing note and vitals reviewed.   ED Course  Procedures (including critical care time)  CRITICAL CARE Performed by: Marily Memos  Total critical care time: 35 minutes Critical care time was exclusive of separately billable procedures and treating other patients. Critical care was necessary to treat or prevent imminent or life-threatening deterioration. Critical care was time spent personally by me on the following activities: development of treatment plan with patient and/or surrogate as well as nursing, discussions with consultants, evaluation of patient's response to treatment, examination of patient, obtaining history from patient or  surrogate, ordering and performing treatments and interventions, ordering and review of laboratory studies, ordering and review of radiographic studies, pulse oximetry and re-evaluation of patient's condition.   Labs Review Labs Reviewed  BASIC METABOLIC PANEL -  Abnormal; Notable for the following:    Glucose, Bld 293 (*)    All other components within normal limits  CBC - Abnormal; Notable for the following:    Hemoglobin 10.8 (*)    HCT 34.9 (*)    MCV 76.4 (*)    MCH 23.6 (*)    All other components within normal limits  BRAIN NATRIURETIC PEPTIDE  I-STAT TROPOININ, ED    Imaging Review Dg Chest 2 View  02/29/2016  CLINICAL DATA:  Shortness of breath with cough for 2 or 3 days. History of hypertension, COPD and congestive heart failure. EXAM: CHEST  2 VIEW COMPARISON:  07/11/2011 and 05/10/2006. FINDINGS: The heart size and mediastinal contours are stable. There is chronic vascular congestion and mild atherosclerosis. No edema, confluent airspace opacity or pleural effusion. No acute osseous findings are seen. There are stable degenerative changes throughout the spine and at both glenohumeral joints. IMPRESSION: No acute cardiopulmonary process. Stable mild cardiomegaly and chronic vascular congestion. Electronically Signed   By: Carey BullocksWilliam  Veazey M.D.   On: 02/29/2016 17:27   I have personally reviewed and evaluated these images and lab results as part of my medical decision-making.   EKG Interpretation   Date/Time:  Wednesday February 29 2016 16:41:25 EDT Ventricular Rate:  97 PR Interval:  134 QRS Duration: 128 QT Interval:  398 QTC Calculation: 505 R Axis:   -50 Text Interpretation:  Normal sinus rhythm Right bundle branch block Left  anterior fascicular block  Bifascicular block  Abnormal ECG  Confirmed by [facility]ension [facility] CampusMESNER MD, Barbara CowerJASON [phone]([id number]) on 02/29/2016 6:49:39 PM      MDM   Final Diagnoses:  Asthma exacerbation Hypoxic respiratory failure, Acute  Likely moderate asthma exacerbation. Will check bnp to ensure no significant HF component. otherwise will give albuterol, steroids, mag, abx and reassess.   On reevaluation, improved WOB, subjective improvement in symptoms as well. Still getting treatment, still with some wheezing, will reeval  again about an hour after med. BNP WNL, doubt CHF. Troponin negative, ECG ok so ACS unlikely.   On reevaluation, patient still significantly tachypneic, wheezing and reported hypoxia to 86% on RA. Plan for overnight obs and more albuterol     Marily MemosJason Fortunata Betty, MD 03/01/16 1056

## 2016-02-29 NOTE — H&P (Signed)
History and Physical  Patient Name: Dominique Hurst     EAV:[id number]    DOB: 1941-08-31    DOA: 02/29/2016 Referring physician: Marily Memos, MD PCP: No primary care provider on file.      Chief Complaint: Dyspnea, cough  HPI: Dominique Hurst is a 75 y.o. female with a past medical history significant for asthma, persistent severe, ?COPD, CAD, and HTN who presents with cough and worsening SOB over several days.  History is collected from EDP and patient.  She reports several days worsening asthma symptoms, specifically increased cough, whitish sputum, and shortness of breath. These have not been associated with fever, chills, rigors, but have also not been completely relieved with her home albuterol inhaler, so she came to the ER.  In the ED, she had a low-grade temperature, tachycardia, tachypnea was otherwise hemodynamically stable and saturating well on room air.  Na 142, K 4.1, Cr 0.6, glucose 293, WBC 5.1K, Hgb 10.8 microcytic, platelets normal.  BNP normal and troponin negative.  CXR without focal opacity or edema and ECG showed sinus tachycardia with old RBBB.    She was given Duo-nebs x2 by EMS and solumedrol, continuous albuterol, and magnesium in the ER and felt improvement.  She lives in [place] and is visiting her daughter.  She reported to pharmacy that she takes Advair, but to me she only knows of an albuterol inhaler.  Denies smoking.  She reports chronic severe asthma, and that "I'vee been intubated in [place], [place] been intubated in [place], get intubated wherever I go with asthma."  Some family members have been sick with URI recently.     Review of Systems:  All other systems negative except as just noted or noted in the history of present illness.  No Known Allergies  Prior to Admission medications   Medication Sig Start Date End Date Taking? Authorizing Provider  acetaminophen (TYLENOL) 500 MG tablet Take 500-1,000 mg by mouth every 6 (six) hours as needed for  moderate pain or headache.   Yes Historical Provider, MD  albuterol (PROVENTIL HFA;VENTOLIN HFA) 108 (90 Base) MCG/ACT inhaler Inhale 1-2 puffs into the lungs every 6 (six) hours as needed for wheezing or shortness of breath.   Yes Historical Provider, MD  aspirin EC 81 MG tablet Take 81 mg by mouth daily.   Yes Historical Provider, MD  Fluticasone-Salmeterol (ADVAIR) 100-50 MCG/DOSE AEPB Inhale 1 puff into the lungs 2 (two) times daily.   Yes Historical Provider, MD  glipiZIDE (GLUCOTROL XL) 10 MG 24 hr tablet Take 10 mg by mouth 2 (two) times daily.   Yes Historical Provider, MD  losartan (COZAAR) 50 MG tablet Take 50 mg by mouth daily as needed (only takes when not taking procardia).   Yes Historical Provider, MD  metFORMIN (GLUCOPHAGE) 500 MG tablet Take 500 mg by mouth 2 (two) times daily with a meal.    Yes Historical Provider, MD  NIFEdipine (PROCARDIA-XL/ADALAT-CC/NIFEDICAL-XL) 30 MG 24 hr tablet Take 30 mg by mouth daily.   Yes Historical Provider, MD  nitroGLYCERIN (NITROSTAT) 0.4 MG SL tablet Place 0.4 mg under the tongue every 5 (five) minutes as needed for chest pain.   Yes Historical Provider, MD  omeprazole (PRILOSEC) 20 MG capsule Take 20 mg by mouth daily.   Yes Historical Provider, MD  Vitamin D, Ergocalciferol, (DRISDOL) 50000 units CAPS capsule Take 50,000 Units by mouth every 7 (seven) days.   Yes Historical Provider, MD    Past Medical History  Diagnosis Date  . Asthma   .  Diabetes mellitus without complication (HCC)   . CHF (congestive heart failure) (HCC)   . COPD (chronic obstructive pulmonary disease) (HCC)   . Diverticulitis   . Bowel obstruction (HCC)   . Anemia     Past Surgical History  Procedure Laterality Date  . Cholecystectomy    . Abdominal hysterectomy      Family history: family history is negative for Asthma.  Social History: Patient lives alon in [place].  She does not smoke or drink.  She uses a cane and is typically independent.  She is  visiting her daughter here in [place].    From [place] originally.  Was a CNA.     Physical Exam: BP 125/59 mmHg  Pulse 105  Temp(Src) 99.3 F (37.4 C) (Oral)  Resp 21  Ht  (1.575 m)  Wt 78.019 kg (172 lb)  BMI 31.45 kg/m2  SpO2 96% General appearance: Well-developed, elderly adult female, alert and in no acute distress.  Speaks in short sentences.   Eyes: Anicteric, conjunctiva pink, lids and lashes normal.     ENT: No nasal deformity, discharge, or epistaxis.  OP moist without lesions.   Lymph: No cervical or supraclavicular lymphadenopathy. Skin: Warm and dry.   Cardiac: RRR, nl S1-S2, no murmurs appreciated.  JVP normal.  No LE edema.   Respiratory: Accessory muscle use.  Diminished on left, rhonchi/coarse wheezes on right, high pitched expiratory wheezes in anterior fields. Abdomen: Abdomen soft without rigidity.  No TTP. No ascites, distension.   MSK: No deformities or effusions. Neuro: Memory poor for where she is (knows she is in [place], has been here 3 days staying with daughter).  Sensorium intact and responding to questions, attention seems normal.  Speech is fluent.  Moves all extremities equally and with normal coordination.    Psych: Behavior appropriate.  Affect normal.  No evidence of aural or visual hallucinations or delusions.       Labs on Admission:  The metabolic panel shows normal electrolytes and renal function. Hyperglycemia. Normal BNP and troponin. The complete blood count shows microcytic anemia without leukocytosis.   Radiological Exams on Admission: Personally reviewed: Dg Chest 2 View  02/29/2016  CLINICAL DATA:  Shortness of breath with cough for 2 or 3 days. History of hypertension, COPD and congestive heart failure. EXAM: CHEST  2 VIEW COMPARISON:  07/11/2011 and 05/10/2006. FINDINGS: The heart size and mediastinal contours are stable. There is chronic vascular congestion and mild atherosclerosis. No edema, confluent airspace  opacity or pleural effusion. No acute osseous findings are seen. There are stable degenerative changes throughout the spine and at both glenohumeral joints. IMPRESSION: No acute cardiopulmonary process. Stable mild cardiomegaly and chronic vascular congestion. Electronically Signed   By: Carey Bullocks M.D.   On: 02/29/2016 17:27    EKG: Independently reviewed. Rate 97, QTc 505 long.  RBBB and LAFB.  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      Assessment/Plan 1. Asthma:  This is new.  Wheezing, cough and dyspnea in setting of recent URI exposure.  Doubt CHF contributing, no evidence of myocardial ischemia.  No clinical pneumonia now.  I do not gather that she has had increased purulent sputum, so will defer further azithromycin especially given QTc. -Nebulized albuterol scheduled and PRN -Prednisone 40 mg, plan for 5 day burst -Check procalcitonin -COPD bundle -Continue home Advair and PPI   2. Chronic presumed diastolic CHF:  Only EF was in 1610 on LHC, documented here.    3. History of  coronary disease and HTN:  Previous LHC x2 but no stent. -Continue home nifedipine, losartan and aspirin  4. Microcytic anemia:  Present since at least 2012.  Somewhat low iron stores at that time.  -Check ferritin and replete if low  5. QTc prolongation: -Avoid azithromycin     DVT PPx: Lovenox Diet: Diabetic, heart healthy Consultants: None Code Status: FULL Family Communication: Called to daughter  Dewain PenningKatoya but no answer Medical decision making: What exists of the patient's previous chart was reviewed in depth and the case was discussed with Dr. Clayborne DanaMesner. Patient seen 11:40 PM on 02/29/2016.  Disposition Plan:  I recommend admission to med surg, observation status.  Clinical condition: stable.  Anticipate observation overnight with continued bronchodilators, transition to prednisone, and discharge to home if not needing O2 and HR improves and procalcitonin low.      Alberteen SamChristopher P  Presley Gora [facility] Pager [phone]

## 2016-02-29 NOTE — ED Notes (Signed)
Pt reports having asthma, has sob everyday but increase in wheezing and sob x 2 days. Has productive cough with white sputum, denies fever.

## 2016-02-29 NOTE — ED Notes (Signed)
Pt is requesting another breathing treatment. Pt family member very hostile and frustrated because I was not able to give a breathing treatment right away and she did not understand that a nurse needed to give that med.

## 2016-03-01 ENCOUNTER — Encounter (HOSPITAL_COMMUNITY): Payer: Self-pay | Admitting: Family Medicine

## 2016-03-01 DIAGNOSIS — D509 Iron deficiency anemia, unspecified: Secondary | ICD-10-CM | POA: Diagnosis present

## 2016-03-01 DIAGNOSIS — I4581 Long QT syndrome: Secondary | ICD-10-CM | POA: Diagnosis not present

## 2016-03-01 DIAGNOSIS — R9431 Abnormal electrocardiogram [ECG] [EKG]: Secondary | ICD-10-CM | POA: Diagnosis present

## 2016-03-01 DIAGNOSIS — J449 Chronic obstructive pulmonary disease, unspecified: Secondary | ICD-10-CM | POA: Diagnosis not present

## 2016-03-01 DIAGNOSIS — J45901 Unspecified asthma with (acute) exacerbation: Secondary | ICD-10-CM | POA: Diagnosis not present

## 2016-03-01 DIAGNOSIS — I2510 Atherosclerotic heart disease of native coronary artery without angina pectoris: Secondary | ICD-10-CM | POA: Diagnosis present

## 2016-03-01 DIAGNOSIS — I2583 Coronary atherosclerosis due to lipid rich plaque: Secondary | ICD-10-CM

## 2016-03-01 LAB — BASIC METABOLIC PANEL
Anion gap: 15 (ref 5–15)
BUN: 10 mg/dL (ref 6–20)
CALCIUM: 9.7 mg/dL (ref 8.9–10.3)
CO2: 25 mmol/L (ref 22–32)
Chloride: 102 mmol/L (ref 101–111)
Creatinine, Ser: 0.8 mg/dL (ref 0.44–1.00)
Glucose, Bld: 359 mg/dL — ABNORMAL HIGH (ref 65–99)
POTASSIUM: 3.8 mmol/L (ref 3.5–5.1)
Sodium: 142 mmol/L (ref 135–145)

## 2016-03-01 LAB — CBC
HCT: 35.2 % — ABNORMAL LOW (ref 36.0–46.0)
HEMATOCRIT: 34 % — AB (ref 36.0–46.0)
HEMOGLOBIN: 10.5 g/dL — AB (ref 12.0–15.0)
HEMOGLOBIN: 11.1 g/dL — AB (ref 12.0–15.0)
MCH: 23.8 pg — ABNORMAL LOW (ref 26.0–34.0)
MCH: 24.2 pg — ABNORMAL LOW (ref 26.0–34.0)
MCHC: 30.9 g/dL (ref 30.0–36.0)
MCHC: 31.5 g/dL (ref 30.0–36.0)
MCV: 76.9 fL — AB (ref 78.0–100.0)
MCV: 76.7 fL — ABNORMAL LOW (ref 78.0–100.0)
Platelets: 195 10*3/uL (ref 150–400)
Platelets: 204 10*3/uL (ref 150–400)
RBC: 4.42 MIL/uL (ref 3.87–5.11)
RBC: 4.59 MIL/uL (ref 3.87–5.11)
RDW: 15.4 % (ref 11.5–15.5)
RDW: 15.5 % (ref 11.5–15.5)
WBC: 6.7 10*3/uL (ref 4.0–10.5)
WBC: 5.9 10*3/uL (ref 4.0–10.5)

## 2016-03-01 LAB — GLUCOSE, CAPILLARY
GLUCOSE-CAPILLARY: 480 mg/dL — AB (ref 65–99)
GLUCOSE-CAPILLARY: 340 mg/dL — AB (ref 65–99)
GLUCOSE-CAPILLARY: 254 mg/dL — AB (ref 65–99)
GLUCOSE-CAPILLARY: 197 mg/dL — AB (ref 65–99)
Glucose-Capillary: 263 mg/dL — ABNORMAL HIGH (ref 65–99)
Glucose-Capillary: 287 mg/dL — ABNORMAL HIGH (ref 65–99)

## 2016-03-01 LAB — PROCALCITONIN

## 2016-03-01 LAB — FERRITIN: FERRITIN: 48 ng/mL (ref 11–307)

## 2016-03-01 LAB — CREATININE, SERUM: Creatinine, Ser: 0.75 mg/dL (ref 0.44–1.00)

## 2016-03-01 MED ORDER — DOXYCYCLINE HYCLATE 100 MG PO TABS
100.0000 mg | ORAL_TABLET | Freq: Two times a day (BID) | ORAL | Status: DC
  Administered 2016-03-01 – 2016-03-06 (×11): 100 mg via ORAL
  Filled 2016-03-01 (×11): qty 1

## 2016-03-01 MED ORDER — ASPIRIN EC 81 MG PO TBEC
81.0000 mg | DELAYED_RELEASE_TABLET | Freq: Every day | ORAL | Status: DC
Start: 2016-03-01 — End: 2016-03-06
  Administered 2016-03-01 – 2016-03-06 (×6): 81 mg via ORAL
  Filled 2016-03-01 (×6): qty 1

## 2016-03-01 MED ORDER — ENOXAPARIN SODIUM 40 MG/0.4ML ~~LOC~~ SOLN
40.0000 mg | SUBCUTANEOUS | Status: DC
  Administered 2016-03-01 – 2016-03-06 (×6): 40 mg via SUBCUTANEOUS
  Filled 2016-03-01 (×6): qty 0.4

## 2016-03-01 MED ORDER — ONDANSETRON HCL 4 MG/2ML IJ SOLN: 4.0000 mg | Freq: Four times a day (QID) | INTRAMUSCULAR | Status: DC | PRN

## 2016-03-01 MED ORDER — ALBUTEROL SULFATE (2.5 MG/3ML) 0.083% IN NEBU
2.5000 mg | INHALATION_SOLUTION | RESPIRATORY_TRACT | Status: DC | PRN
  Administered 2016-03-01 – 2016-03-02 (×4): 2.5 mg via RESPIRATORY_TRACT
  Filled 2016-03-01 (×4): qty 3

## 2016-03-01 MED ORDER — ACETAMINOPHEN 325 MG PO TABS
650.0000 mg | ORAL_TABLET | Freq: Four times a day (QID) | ORAL | Status: DC | PRN
  Administered 2016-03-01 – 2016-03-05 (×6): 650 mg via ORAL
  Filled 2016-03-01 (×6): qty 2

## 2016-03-01 MED ORDER — SENNOSIDES-DOCUSATE SODIUM 8.6-50 MG PO TABS
1.0000 | ORAL_TABLET | Freq: Every evening | ORAL | Status: DC | PRN
  Administered 2016-03-02: 1 via ORAL
  Filled 2016-03-01: qty 1

## 2016-03-01 MED ORDER — NIFEDIPINE ER OSMOTIC RELEASE 30 MG PO TB24
30.0000 mg | ORAL_TABLET | Freq: Every day | ORAL | Status: DC
Start: 2016-03-01 — End: 2016-03-06
  Administered 2016-03-01 – 2016-03-06 (×6): 30 mg via ORAL
  Filled 2016-03-01 (×7): qty 1

## 2016-03-01 MED ORDER — ACETAMINOPHEN 650 MG RE SUPP
650.0000 mg | Freq: Four times a day (QID) | RECTAL | Status: DC | PRN
Start: 2016-03-01 — End: 2016-03-02

## 2016-03-01 MED ORDER — GUAIFENESIN-DM 100-10 MG/5ML PO SYRP
5.0000 mL | ORAL_SOLUTION | ORAL | Status: DC | PRN
  Administered 2016-03-01 – 2016-03-04 (×4): 5 mL via ORAL
  Filled 2016-03-01 (×4): qty 5

## 2016-03-01 MED ORDER — ONDANSETRON HCL 4 MG PO TABS: 4.0000 mg | ORAL_TABLET | Freq: Four times a day (QID) | ORAL | Status: DC | PRN

## 2016-03-01 MED ORDER — GUAIFENESIN ER 600 MG PO TB12
600.0000 mg | ORAL_TABLET | Freq: Two times a day (BID) | ORAL | Status: DC
  Administered 2016-03-01 – 2016-03-06 (×11): 600 mg via ORAL
  Filled 2016-03-01 (×12): qty 1

## 2016-03-01 MED ORDER — INSULIN ASPART 100 UNIT/ML ~~LOC~~ SOLN
0.0000 [IU] | Freq: Three times a day (TID) | SUBCUTANEOUS | Status: DC
  Administered 2016-03-01: 3 [IU] via SUBCUTANEOUS
  Administered 2016-03-01 (×2): 8 [IU] via SUBCUTANEOUS
  Administered 2016-03-02: 3 [IU] via SUBCUTANEOUS
  Administered 2016-03-02: 15 [IU] via SUBCUTANEOUS
  Administered 2016-03-02: 11 [IU] via SUBCUTANEOUS
  Administered 2016-03-03: 8 [IU] via SUBCUTANEOUS
  Administered 2016-03-03: 15 [IU] via SUBCUTANEOUS
  Administered 2016-03-03: 8 [IU] via SUBCUTANEOUS
  Administered 2016-03-04: 15 [IU] via SUBCUTANEOUS
  Administered 2016-03-04: 11 [IU] via SUBCUTANEOUS
  Administered 2016-03-05: 3 [IU] via SUBCUTANEOUS
  Administered 2016-03-05: 15 [IU] via SUBCUTANEOUS
  Administered 2016-03-05: 11 [IU] via SUBCUTANEOUS

## 2016-03-01 MED ORDER — PANTOPRAZOLE SODIUM 40 MG PO TBEC
40.0000 mg | DELAYED_RELEASE_TABLET | Freq: Every day | ORAL | Status: DC
  Administered 2016-03-01 – 2016-03-06 (×6): 40 mg via ORAL
  Filled 2016-03-01 (×6): qty 1

## 2016-03-01 MED ORDER — LOSARTAN POTASSIUM 50 MG PO TABS
50.0000 mg | ORAL_TABLET | Freq: Every day | ORAL | Status: DC
  Administered 2016-03-01 – 2016-03-06 (×6): 50 mg via ORAL
  Filled 2016-03-01 (×6): qty 1

## 2016-03-01 MED ORDER — ALBUTEROL SULFATE (2.5 MG/3ML) 0.083% IN NEBU
2.5000 mg | INHALATION_SOLUTION | Freq: Four times a day (QID) | RESPIRATORY_TRACT | Status: DC
  Administered 2016-03-01 – 2016-03-02 (×3): 2.5 mg via RESPIRATORY_TRACT
  Filled 2016-03-01 (×4): qty 3

## 2016-03-01 MED ORDER — PREDNISONE 20 MG PO TABS
40.0000 mg | ORAL_TABLET | Freq: Every day | ORAL | Status: DC
  Administered 2016-03-01 – 2016-03-02 (×2): 40 mg via ORAL
  Filled 2016-03-01 (×2): qty 2

## 2016-03-01 MED ORDER — INSULIN ASPART 100 UNIT/ML ~~LOC~~ SOLN
10.0000 [IU] | Freq: Once | SUBCUTANEOUS | Status: AC
  Administered 2016-03-01: 10 [IU] via SUBCUTANEOUS

## 2016-03-01 MED ORDER — INSULIN ASPART 100 UNIT/ML ~~LOC~~ SOLN
0.0000 [IU] | Freq: Every day | SUBCUTANEOUS | Status: DC
  Administered 2016-03-01: 5 [IU] via SUBCUTANEOUS
  Administered 2016-03-01: 3 [IU] via SUBCUTANEOUS
  Administered 2016-03-02 – 2016-03-05 (×4): 2 [IU] via SUBCUTANEOUS

## 2016-03-01 MED ORDER — MOMETASONE FURO-FORMOTEROL FUM 100-5 MCG/ACT IN AERO
2.0000 | INHALATION_SPRAY | Freq: Two times a day (BID) | RESPIRATORY_TRACT | Status: DC
Start: 2016-03-01 — End: 2016-03-06
  Administered 2016-03-01 – 2016-03-06 (×8): 2 via RESPIRATORY_TRACT
  Filled 2016-03-01 (×3): qty 8.8

## 2016-03-01 NOTE — Progress Notes (Signed)
[facility] PROGRESS NOTE  Dominique Hurst JXB:[id number] DOB: Jul 06, 1941 DOA: 02/29/2016 PCP: in [place], [place]  75 y/o female resident of [place] with hx fo asthma, ? COPD CAD and HTN admitted with severeal days of cough and dyspnea on exertion. Pt admission with asthma  vs COPD exacerbation.   Assessment/Plan: Acute asthma /? COPD exacerbation continue po prednisone and scheduled duoneb. Very wheezy on exam and coughing. Resume home inhalers. Needs another day for improvement in symptoms. Supportive care with tylenol and antitussives. Avoided quinolones or macrolides due to prolonged Qtc. Will add doxycycline.   Has hx of intubation. Reports she's working on getting nebs in [place].  Chronic diastolic CHF:  Only EF was in 1308 on LHC, documented here. euvolemic. normal BNP.   History of coronary disease and HTN:  Previous LHC x2 but no stent. -Continue home nifedipine, losartan and aspirin  Hyperglycemia ? Due to steroids  no hx of DM. Monitor on SSI. Check A1C     Microcytic anemia:  H&h stable  5. QTc prolongation: -Avoid azithromycin     DVT PPx: Lovenox Diet: Diabetic, heart healthy  Code Status: full code Family Communication: none at bedside Disposition Plan: home in 24-48 hrs if better. Plans to go back to [place]    Consultants:  none  Procedures:  none  Antibiotics:  Doxycycline 4/13--  HPI/Subjective: Still coughing and dyspneic  Objective: Filed Vitals:   03/01/16 0101 03/01/16 0650  BP: 168/83 148/94  Pulse: 105 78  Temp: 97.6 F (36.4 C) 98.3 F (36.8 C)  Resp: 19 18    Intake/Output Summary (Last 24 hours) at 03/01/16 0942 Last data filed at 03/01/16 0651  Gross per 24 hour  Intake      0 ml  Output    750 ml  Net   -750 ml   Filed Weights   02/29/16 1652 03/01/16 0101  Weight: 78.019 kg (172 lb) 76.5 kg (168 lb 10.4 oz)    Exam:   General: not in distress, coughing  HEENT: no pallor, moist mucosa  Cardiovascular:  NS1&S2, no murmurs  Respiratory: diffuse wheezing b/l  Abdomen: soft, N,T ND,BS+  Musculoskeletal: warm, no edema   Data Reviewed: Basic Metabolic Panel:  Recent Labs Lab 02/29/16 1654 03/01/16 0205 03/01/16 0552  NA 142  --  142  K 4.1  --  3.8  CL 104  --  102  CO2 26  --  25  GLUCOSE 293*  --  359*  BUN 9  --  10  CREATININE 0.64 0.75 0.80  CALCIUM 9.4  --  9.7   Liver Function Tests: No results for input(s): AST, ALT, ALKPHOS, BILITOT, PROT, ALBUMIN in the last 168 hours. No results for input(s): LIPASE, AMYLASE in the last 168 hours. No results for input(s): AMMONIA in the last 168 hours. CBC:  Recent Labs Lab 02/29/16 1654 03/01/16 0205 03/01/16 0552  WBC 5.1 6.7 5.9  HGB 10.8* 10.5* 11.1*  HCT 34.9* 34.0* 35.2*  MCV 76.4* 76.9* 76.7*  PLT 180 195 204   Cardiac Enzymes: No results for input(s): CKTOTAL, CKMB, CKMBINDEX, TROPONINI in the last 168 hours. BNP (last 3 results)  Recent Labs  02/29/16 1703  BNP 52.9    ProBNP (last 3 results) No results for input(s): PROBNP in the last 8760 hours.  CBG:  Recent Labs Lab 03/01/16 0151 03/01/16 0517 03/01/16 0838  GLUCAP 480* 340* 254*    No results found for this or any previous visit (from the past 240  hour(s)).   Studies: Dg Chest 2 View  02/29/2016  CLINICAL DATA:  Shortness of breath with cough for 2 or 3 days. History of hypertension, COPD and congestive heart failure. EXAM: CHEST  2 VIEW COMPARISON:  07/11/2011 and 05/10/2006. FINDINGS: The heart size and mediastinal contours are stable. There is chronic vascular congestion and mild atherosclerosis. No edema, confluent airspace opacity or pleural effusion. No acute osseous findings are seen. There are stable degenerative changes throughout the spine and at both glenohumeral joints. IMPRESSION: No acute cardiopulmonary process. Stable mild cardiomegaly and chronic vascular congestion. Electronically Signed   By: Carey BullocksWilliam  Veazey M.D.   On:  02/29/2016 17:27    Scheduled Meds: . albuterol  2.5 mg Nebulization Q6H  . aspirin EC  81 mg Oral Daily  . enoxaparin (LOVENOX) injection  40 mg Subcutaneous Q24H  . insulin aspart  0-15 Units Subcutaneous TID WC  . insulin aspart  0-5 Units Subcutaneous QHS  . losartan  50 mg Oral Daily  . mometasone-formoterol  2 puff Inhalation BID  . NIFEdipine  30 mg Oral Daily  . pantoprazole  40 mg Oral Daily  . predniSONE  40 mg Oral Q breakfast   Continuous Infusions:     Time spent: 25 minutes    Dominique Hurst  [facility] Pager [phone]. If 7PM-7AM, please contact night-coverage at [email or web address], password [email or web address] 03/01/2016, 9:42 AM

## 2016-03-01 NOTE — Progress Notes (Signed)
Results for [name], [name] (MRN [id number]) as of 03/01/2016 12:37  Ref. Range 03/01/2016 01:51 03/01/2016 05:17 03/01/2016 08:38  Glucose-Capillary Latest Ref Range: 65-99 mg/dL 409480 (H) 811340 (H) 914254 (H)  Noted that blood sugars continue to be elevated.  On Prednisone. Recommend adding Novolog 3-4 units TID with meals if blood sugars continue to be greater than 200 mg/dl. Continue Novolog MODERATE correction scale TID & HS. Will continue to monitor blood sugars while in the hospital. Smith MinceKendra Joziah Dollins RN BSN CDE

## 2016-03-01 NOTE — ED Notes (Signed)
Report attempted to 6N. No one answering the unit phone at this time.

## 2016-03-01 NOTE — Progress Notes (Signed)
Guaifenesin po ordered by MD per pt 's request due to coughing

## 2016-03-01 NOTE — Progress Notes (Signed)
SATURATION QUALIFICATIONS: (This note is used to comply with regulatory documentation for home oxygen)  Patient Saturations on Room Air at Rest =100%  Patient Saturations on Room Air while Ambulating highest 96%, lowest 88%  Patient Saturations on  2Liters of oxygen while Ambulating = 99%  Please briefly explain why patient needs home oxygen:

## 2016-03-02 DIAGNOSIS — Z7951 Long term (current) use of inhaled steroids: Secondary | ICD-10-CM | POA: Diagnosis not present

## 2016-03-02 DIAGNOSIS — R739 Hyperglycemia, unspecified: Secondary | ICD-10-CM | POA: Diagnosis present

## 2016-03-02 DIAGNOSIS — E119 Type 2 diabetes mellitus without complications: Secondary | ICD-10-CM | POA: Diagnosis not present

## 2016-03-02 DIAGNOSIS — R0600 Dyspnea, unspecified: Secondary | ICD-10-CM | POA: Diagnosis present

## 2016-03-02 DIAGNOSIS — D509 Iron deficiency anemia, unspecified: Secondary | ICD-10-CM | POA: Diagnosis present

## 2016-03-02 DIAGNOSIS — J410 Simple chronic bronchitis: Secondary | ICD-10-CM | POA: Diagnosis not present

## 2016-03-02 DIAGNOSIS — J441 Chronic obstructive pulmonary disease with (acute) exacerbation: Secondary | ICD-10-CM | POA: Diagnosis present

## 2016-03-02 DIAGNOSIS — Z7982 Long term (current) use of aspirin: Secondary | ICD-10-CM | POA: Diagnosis not present

## 2016-03-02 DIAGNOSIS — Z7984 Long term (current) use of oral hypoglycemic drugs: Secondary | ICD-10-CM | POA: Diagnosis not present

## 2016-03-02 DIAGNOSIS — I4581 Long QT syndrome: Secondary | ICD-10-CM | POA: Diagnosis present

## 2016-03-02 DIAGNOSIS — J45901 Unspecified asthma with (acute) exacerbation: Secondary | ICD-10-CM | POA: Diagnosis present

## 2016-03-02 DIAGNOSIS — J438 Other emphysema: Secondary | ICD-10-CM | POA: Diagnosis not present

## 2016-03-02 DIAGNOSIS — I5032 Chronic diastolic (congestive) heart failure: Secondary | ICD-10-CM | POA: Diagnosis present

## 2016-03-02 DIAGNOSIS — I2583 Coronary atherosclerosis due to lipid rich plaque: Secondary | ICD-10-CM | POA: Diagnosis present

## 2016-03-02 DIAGNOSIS — I110 Hypertensive heart disease with heart failure: Secondary | ICD-10-CM | POA: Diagnosis present

## 2016-03-02 DIAGNOSIS — B9789 Other viral agents as the cause of diseases classified elsewhere: Secondary | ICD-10-CM | POA: Diagnosis present

## 2016-03-02 DIAGNOSIS — J208 Acute bronchitis due to other specified organisms: Secondary | ICD-10-CM | POA: Diagnosis present

## 2016-03-02 DIAGNOSIS — I2510 Atherosclerotic heart disease of native coronary artery without angina pectoris: Secondary | ICD-10-CM | POA: Diagnosis present

## 2016-03-02 DIAGNOSIS — J440 Chronic obstructive pulmonary disease with acute lower respiratory infection: Secondary | ICD-10-CM | POA: Diagnosis not present

## 2016-03-02 DIAGNOSIS — Z79899 Other long term (current) drug therapy: Secondary | ICD-10-CM | POA: Diagnosis not present

## 2016-03-02 DIAGNOSIS — T380X5A Adverse effect of glucocorticoids and synthetic analogues, initial encounter: Secondary | ICD-10-CM | POA: Diagnosis present

## 2016-03-02 LAB — GLUCOSE, CAPILLARY
GLUCOSE-CAPILLARY: 170 mg/dL — AB (ref 65–99)
Glucose-Capillary: 306 mg/dL — ABNORMAL HIGH (ref 65–99)
Glucose-Capillary: 403 mg/dL — ABNORMAL HIGH (ref 65–99)
Glucose-Capillary: 246 mg/dL — ABNORMAL HIGH (ref 65–99)

## 2016-03-02 LAB — INFLUENZA PANEL BY PCR (TYPE A & B)
H1N1FLUPCR: NOT DETECTED
Influenza A By PCR: NEGATIVE
Influenza B By PCR: NEGATIVE

## 2016-03-02 LAB — HEMOGLOBIN A1C
HEMOGLOBIN A1C: 10.2 % — AB (ref 4.8–5.6)
MEAN PLASMA GLUCOSE: 246 mg/dL

## 2016-03-02 LAB — STREP PNEUMONIAE URINARY ANTIGEN: Strep Pneumo Urinary Antigen: NEGATIVE

## 2016-03-02 LAB — MRSA PCR SCREENING: MRSA BY PCR: NEGATIVE

## 2016-03-02 LAB — EXPECTORATED SPUTUM ASSESSMENT W REFEX TO RESP CULTURE

## 2016-03-02 MED ORDER — ALBUTEROL SULFATE (2.5 MG/3ML) 0.083% IN NEBU
2.5000 mg | INHALATION_SOLUTION | RESPIRATORY_TRACT | Status: DC
Start: 2016-03-02 — End: 2016-03-02
  Administered 2016-03-02 (×3): 2.5 mg via RESPIRATORY_TRACT
  Filled 2016-03-02 (×3): qty 3

## 2016-03-02 MED ORDER — SENNOSIDES-DOCUSATE SODIUM 8.6-50 MG PO TABS
2.0000 | ORAL_TABLET | Freq: Two times a day (BID) | ORAL | Status: DC
  Administered 2016-03-03 – 2016-03-06 (×6): 2 via ORAL
  Filled 2016-03-02 (×7): qty 2

## 2016-03-02 MED ORDER — MAGNESIUM SULFATE 2 GM/50ML IV SOLN
2.0000 g | Freq: Once | INTRAVENOUS | Status: AC
  Administered 2016-03-02: 2 g via INTRAVENOUS
  Filled 2016-03-02: qty 50

## 2016-03-02 MED ORDER — METHYLPREDNISOLONE SODIUM SUCC 40 MG IJ SOLR
40.0000 mg | Freq: Once | INTRAMUSCULAR | Status: AC
  Administered 2016-03-02: 40 mg via INTRAVENOUS
  Filled 2016-03-02: qty 1

## 2016-03-02 MED ORDER — METHYLPREDNISOLONE SODIUM SUCC 125 MG IJ SOLR
60.0000 mg | Freq: Three times a day (TID) | INTRAMUSCULAR | Status: DC
  Administered 2016-03-02 – 2016-03-05 (×8): 60 mg via INTRAVENOUS
  Filled 2016-03-02 (×8): qty 2

## 2016-03-02 MED ORDER — IPRATROPIUM-ALBUTEROL 0.5-2.5 (3) MG/3ML IN SOLN
3.0000 mL | Freq: Four times a day (QID) | RESPIRATORY_TRACT | Status: DC
  Administered 2016-03-02 – 2016-03-06 (×15): 3 mL via RESPIRATORY_TRACT
  Filled 2016-03-02 (×14): qty 3

## 2016-03-02 NOTE — Progress Notes (Signed)
Dominique Hurst. Callahan responded by placing an order for SoluMedrol 40 mg/ml Inj once.  Administered SoluMedrol as ordered and will monitor for effectivity.

## 2016-03-02 NOTE — Progress Notes (Signed)
Results for [name], Violet (MRN [id number]) as of 03/02/2016 13:32  Ref. Range 03/01/2016 12:30 03/01/2016 16:52 03/01/2016 21:36 03/02/2016 08:17 03/02/2016 11:23  Glucose-Capillary Latest Ref Range: 65-99 mg/dL 409263 (H) 811197 (H) 914287 (H) 306 (H) 403 (H)  Noted that blood sugars continue to be greater than 250 mg/dl.  Noted that HgbA1C is 10.2%. Recommend starting Lantus 20 units daily, continuing Novolog MODERATE correction scale TID & HS. Will continue to follow blood sugars while in the hospital. Smith MinceKendra Akili Corsetti RN BSN CDE

## 2016-03-02 NOTE — Progress Notes (Signed)
[facility] PROGRESS NOTE  Dominique Hurst VHQ:[id number] DOB: 04-10-1941 DOA: 02/29/2016 PCP: in [place], [place]  75 y/o female resident of [place] with hx fo asthma, ? COPD CAD and HTN admitted with severeal days of cough and dyspnea on exertion. Pt admission with asthma  vs COPD exacerbation.   Assessment/Plan: Acute asthma /? COPD exacerbation  Has hx of intubation. Reports she's working on getting nebs in [place]. Significant diffuse bilateral wheezing, no improvement on current regimen, will change oral steroids to iv solumedrol, change albuterol to duoneb, add iv magx1 Check flu pcr, respiratory viral panel, sputum culture, urine strep and legionella antigen, check mrsa screen. Supportive care with tylenol and antitussives.  Avoided quinolones or macrolides due to prolonged Qtc. Continue doxycycline.   Chronic diastolic CHF:  Only EF was in 4132 on LHC, documented here. euvolemic. normal BNP.   History of coronary disease and HTN:  Previous LHC x2 but no stent. -Continue home nifedipine, losartan and aspirin  Hyperglycemia ? Due to steroids  no hx of DM. Monitor on SSI. A1C 10.2, will need to discharge on insulin/metformin    Microcytic anemia:  H&h stable  QTc prolongation: -start tele, repeat am ekg, monitor QT interval     DVT PPx: Lovenox Diet: Diabetic, heart healthy  Code Status: full code Family Communication: none at bedside Disposition Plan: still significant wheezing. Plans to go back to [place]    Consultants:  none  Procedures:  none  Antibiotics:  Doxycycline 4/13--  HPI/Subjective: Rapid response was called last night due to c/o sob, this am , on room air, does not seem in respiratory distress, but still significant wheezing, cough  Objective: Filed Vitals:   03/02/16 0456 03/02/16 1328  BP: 150/77 145/71  Pulse: 87 85  Temp: 98.8 F (37.1 C) 98.2 F (36.8 C)  Resp: 16 18    Intake/Output Summary (Last 24 hours) at 03/02/16  1902 Last data filed at 03/02/16 1815  Gross per 24 hour  Intake   1200 ml  Output   2600 ml  Net  -1400 ml   Filed Weights   02/29/16 1652 03/01/16 0101  Weight: 78.019 kg (172 lb) 76.5 kg (168 lb 10.4 oz)    Exam:   General: not in distress, coughing  HEENT: no pallor, moist mucosa  Cardiovascular: NS1&S2, no murmurs  Respiratory: diffuse wheezing b/l  Abdomen: soft, N,T ND,BS+  Musculoskeletal: warm, no edema   Data Reviewed: Basic Metabolic Panel:  Recent Labs Lab 02/29/16 1654 03/01/16 0205 03/01/16 0552  NA 142  --  142  K 4.1  --  3.8  CL 104  --  102  CO2 26  --  25  GLUCOSE 293*  --  359*  BUN 9  --  10  CREATININE 0.64 0.75 0.80  CALCIUM 9.4  --  9.7   Liver Function Tests: No results for input(s): AST, ALT, ALKPHOS, BILITOT, PROT, ALBUMIN in the last 168 hours. No results for input(s): LIPASE, AMYLASE in the last 168 hours. No results for input(s): AMMONIA in the last 168 hours. CBC:  Recent Labs Lab 02/29/16 1654 03/01/16 0205 03/01/16 0552  WBC 5.1 6.7 5.9  HGB 10.8* 10.5* 11.1*  HCT 34.9* 34.0* 35.2*  MCV 76.4* 76.9* 76.7*  PLT 180 195 204   Cardiac Enzymes: No results for input(s): CKTOTAL, CKMB, CKMBINDEX, TROPONINI in the last 168 hours. BNP (last 3 results)  Recent Labs  02/29/16 1703  BNP 52.9    ProBNP (last 3 results) No  results for input(s): PROBNP in the last 8760 hours.  CBG:  Recent Labs Lab 03/01/16 1652 03/01/16 2136 03/02/16 0817 03/02/16 1123 03/02/16 1721  GLUCAP 197* 287* 306* 403* 170*    No results found for this or any previous visit (from the past 240 hour(s)).   Studies: No results found.  Scheduled Meds: . aspirin EC  81 mg Oral Daily  . doxycycline  100 mg Oral Q12H  . enoxaparin (LOVENOX) injection  40 mg Subcutaneous Q24H  . guaiFENesin  600 mg Oral BID  . insulin aspart  0-15 Units Subcutaneous TID WC  . insulin aspart  0-5 Units Subcutaneous QHS  . ipratropium-albuterol  3 mL  Nebulization Q6H  . losartan  50 mg Oral Daily  . magnesium sulfate 1 - 4 g bolus IVPB  2 g Intravenous Once  . methylPREDNISolone (SOLU-MEDROL) injection  60 mg Intravenous 3 times per day  . mometasone-formoterol  2 puff Inhalation BID  . NIFEdipine  30 mg Oral Daily  . pantoprazole  40 mg Oral Daily  . senna-docusate  2 tablet Oral BID   Continuous Infusions:     Time spent: 25 minutes    Dominique Cassell MD PhD [facility] Pager [phone] If 7PM-7AM, please contact night-coverage at [email or web address], password [email or web address] 03/02/2016, 7:02 PM

## 2016-03-02 NOTE — Progress Notes (Addendum)
Patient complain that the breathing treatment did not help her asthma.  Expiratory wheezes still noted despite the PRN albuterol nebs Q2H given.  O2Sat is 100% on 2L/min via [place]. Paged RT and Rapid Response to further evaluate patient's breathing condition.  RT Tim responded and noted expiratory wheezes and will come back to administer the scheduled Neb tx.  Rapid Response came to see patient and advised this RN to call MD to give one time solumedrol dose IV.  Will paged floor coverage MD.

## 2016-03-02 NOTE — Progress Notes (Signed)
Rapid Response Event Note  Overview:  Called to unit for patient in respiratory distress      Initial Focused Assessment: Patient is a chronic COPD patient who was having more increased work of breathing and complaints of not being able to catch breath. Patient is noted to have audible wheezing, denies any chest pain, and anxious.  Interventions: Patient given duoneb prn, and request for primary team to given IV steroid as patient recently converted from IV steroids to po prednisone.  IV solumedrol given once prn via [facility]. Upon leaving patient has decreased work of breathing and is resting.  Bedside RN advised to call with any further needs.  Will continue to monitor the patient closely.    Event Summary:   at   NP [facility](Called by Primary RN)    at  [phone]        Faylene MillionSmith, Obe Ahlers Allen  RN  Rapid Response Team

## 2016-03-03 DIAGNOSIS — Z794 Long term (current) use of insulin: Secondary | ICD-10-CM

## 2016-03-03 LAB — BASIC METABOLIC PANEL
Anion gap: 10 (ref 5–15)
BUN: 17 mg/dL (ref 6–20)
CALCIUM: 9.3 mg/dL (ref 8.9–10.3)
CO2: 27 mmol/L (ref 22–32)
Chloride: 103 mmol/L (ref 101–111)
Creatinine, Ser: 0.75 mg/dL (ref 0.44–1.00)
GFR calc Af Amer: >60 mL/min (ref >60)
GLUCOSE: 358 mg/dL — AB (ref 65–99)
Potassium: 4.6 mmol/L (ref 3.5–5.1)
SODIUM: 140 mmol/L (ref 135–145)

## 2016-03-03 LAB — CBC
HCT: 33 % — ABNORMAL LOW (ref 36.0–46.0)
Hemoglobin: 10.1 g/dL — ABNORMAL LOW (ref 12.0–15.0)
MCH: 23.5 pg — AB (ref 26.0–34.0)
MCHC: 30.6 g/dL (ref 30.0–36.0)
MCV: 76.7 fL — AB (ref 78.0–100.0)
PLATELETS: 205 10*3/uL (ref 150–400)
RBC: 4.3 MIL/uL (ref 3.87–5.11)
RDW: 15.3 % (ref 11.5–15.5)
WBC: 4.5 10*3/uL (ref 4.0–10.5)

## 2016-03-03 LAB — GLUCOSE, CAPILLARY
Glucose-Capillary: 291 mg/dL — ABNORMAL HIGH (ref 65–99)
Glucose-Capillary: 355 mg/dL — ABNORMAL HIGH (ref 65–99)
Glucose-Capillary: 411 mg/dL — ABNORMAL HIGH (ref 65–99)
Glucose-Capillary: 299 mg/dL — ABNORMAL HIGH (ref 65–99)
Glucose-Capillary: 203 mg/dL — ABNORMAL HIGH (ref 65–99)

## 2016-03-03 LAB — PROCALCITONIN: Procalcitonin: <0.1 ng/mL

## 2016-03-03 MED ORDER — INSULIN GLARGINE 100 UNIT/ML ~~LOC~~ SOLN
10.0000 [IU] | Freq: Two times a day (BID) | SUBCUTANEOUS | Status: DC
  Administered 2016-03-03 (×2): 10 [IU] via SUBCUTANEOUS
  Filled 2016-03-03 (×5): qty 0.1

## 2016-03-03 MED ORDER — ALBUTEROL SULFATE (2.5 MG/3ML) 0.083% IN NEBU
2.5000 mg | INHALATION_SOLUTION | RESPIRATORY_TRACT | Status: DC | PRN
  Administered 2016-03-03 – 2016-03-06 (×2): 2.5 mg via RESPIRATORY_TRACT
  Filled 2016-03-03 (×4): qty 3

## 2016-03-03 NOTE — Evaluation (Signed)
Physical Therapy Evaluation Patient Details Name: Dominique MacadamJudith Hurst MRN: [id number] DOB: [birth year] Today's Date: 03/03/2016   History of Present Illness  Patient is a 75 yo female admitted 02/29/16 with asthma exacerbation.   PMH:  asthma, COPD, CAD, HTN, CHF  Clinical Impression  Patient presents with problems listed below.  Will benefit from acute PT to maximize functional independence prior to discharge.  Patient moving fairly well - primarily limited by SOB/DOE decreasing activity tolerance.  Recommend f/u HHPT for continued therapy at d/c.    Follow Up Recommendations Home health PT;Supervision for mobility/OOB    Equipment Recommendations  None recommended by PT    Recommendations for Other Services       Precautions / Restrictions Precautions Precautions: Fall Restrictions Weight Bearing Restrictions: No      Mobility  Bed Mobility               General bed mobility comments: Patient in recliner  Transfers Overall transfer level: Needs assistance Equipment used: Rolling walker (2 wheeled) Transfers: Sit to/from Stand Sit to Stand: Supervision         General transfer comment: Assist for safety  Ambulation/Gait Ambulation/Gait assistance: Min guard Ambulation Distance (Feet): 30 Feet Assistive device: Rolling walker (2 wheeled) Gait Pattern/deviations: Step-through pattern;Decreased stride length;Shuffle;Trunk flexed Gait velocity: decreased Gait velocity interpretation: Below normal speed for age/gender General Gait Details: Demonstrates safe use of RW.  Patient with DOE during gait, limiting distance to 30'.  Stairs            Wheelchair Mobility    Modified Rankin (Stroke Patients Only)       Balance Overall balance assessment: Needs assistance         Standing balance support: Single extremity supported Standing balance-Leahy Scale: Fair                               Pertinent Vitals/Pain Pain Assessment: No/denies  pain    Home Living Family/patient expects to be discharged to:: Unsure (Pt reports returning to [place] right away on bus.) Living Arrangements: Alone (In her apartment in [place]) Available Help at Discharge: Family;Available PRN/intermittently (Daughter and grandchildren supportive in [place]) Type of Home: Apartment Home Access: Elevator     Home Layout: One level Home Equipment: Walker - 2 wheels      Prior Function Level of Independence: Independent with assistive device(s);Needs assistance   Gait / Transfers Assistance Needed: Uses RW  ADL's / Homemaking Assistance Needed: Daughter assists with bath  Comments: Family assists with errands, groceries, etc.     Hand Dominance        Extremity/Trunk Assessment   Upper Extremity Assessment: Generalized weakness           Lower Extremity Assessment: Generalized weakness         Communication   Communication: No difficulties  Cognition Arousal/Alertness: Awake/alert Behavior During Therapy: WFL for tasks assessed/performed Overall Cognitive Status: Within Functional Limits for tasks assessed                      General Comments      Exercises        Assessment/Plan    PT Assessment Patient needs continued PT services  PT Diagnosis Difficulty walking;Generalized weakness   PT Problem List Decreased strength;Decreased activity tolerance;Decreased balance;Decreased mobility;Cardiopulmonary status limiting activity  PT Treatment Interventions DME instruction;Gait training;Functional mobility training;Therapeutic activities;Patient/family education   PT Goals (Current goals can  be found in the Care Plan section) Acute Rehab PT Goals Patient Stated Goal: To return home PT Goal Formulation: With patient Time For Goal Achievement: 03/10/16 Potential to Achieve Goals: Good    Frequency Min 3X/week   Barriers to discharge Decreased caregiver support Lives alone.  Unsure of discharge plan.    Co-evaluation                End of Session Equipment Utilized During Treatment: Gait belt;Oxygen Activity Tolerance: Patient limited by fatigue;Treatment limited secondary to medical complications (Comment) (Limited by DOE) Patient left: in chair;with call bell/phone within reach;with chair alarm set Nurse Communication: Mobility status (dyspnea)         Time: 1610-9604 PT Time Calculation (min) (ACUTE ONLY): 20 min   Charges:   PT Evaluation $PT Eval Moderate Complexity: 1 Procedure     PT G CodesVena Austria 2016/04/01, 5:12 PM Durenda Hurt. Renaldo Fiddler, [facility] Acute Rehab Services Pager [phone]

## 2016-03-03 NOTE — Progress Notes (Signed)
[facility] PROGRESS NOTE  Dominique MacadamJudith Hurst WUJ:[id number] DOB: 1941/04/17 DOA: 02/29/2016 [place]  75 y/o female resident of [place] with hx fo asthma, ? COPD CAD and HTN admitted with severeal days of cough and dyspnea on exertion. Pt admission with asthma  vs COPD exacerbation.   Assessment/Plan: Acute asthma /? COPD exacerbation  Has hx of intubation. Reports she's working on getting nebs in [place]. Significant diffuse bilateral wheezing, no improvement on current regimen, will change oral steroids to iv solumedrol, change albuterol to duoneb, add iv magx1 negative flu pcr, respiratory viral panel pending, sputum culture, urine strep pneumo antigen negative, legionella antigen pending, mrsa screen negative. Supportive care with tylenol and antitussives.  Avoided quinolones or macrolides due to prolonged Qtc. Continue doxycycline.   Chronic diastolic CHF:  Only EF was in [id number] on LHC, documented here. euvolemic. normal BNP.   History of coronary disease and HTN:  Previous LHC x2 but no stent. -Continue home nifedipine, losartan and aspirin  Hyperglycemia ? Due to steroids  no hx of DM. Monitor on SSI. A1C 10.2, start lantus 10units bid while on iv steroids, will need to discharge on insulin/metformin, patient agreed to it.    Microcytic anemia:  H&h stable  QTc prolongation: -start tele, repeat ekg in am, QTc improved from 505 ms to 486ms     DVT PPx: Lovenox Diet: Diabetic, heart healthy  Code Status: full code Family Communication: none at bedside Disposition Plan: d/c in 1-2 days. Plans to go back to [place]    Consultants:  none  Procedures:  none  Antibiotics:  Doxycycline 4/13--  HPI/Subjective: On 2liter oxygen, less wheezing , less cough, does not seem in respiratory distress,   Objective: Filed Vitals:   03/02/16 2135 03/03/16 0649  BP: 119/57 154/73  Pulse: 79 83  Temp: 98.4 F (36.9 C) 97.8 F (36.6 C)  Resp: 18 18     Intake/Output Summary (Last 24 hours) at 03/03/16 13080822 Last data filed at 03/03/16 0700  Gross per 24 hour  Intake   1370 ml  Output   3150 ml  Net  -1780 ml   Filed Weights   02/29/16 1652 03/01/16 0101  Weight: 78.019 kg (172 lb) 76.5 kg (168 lb 10.4 oz)    Exam:   General: not in distress, coughing  HEENT: no pallor, moist mucosa  Cardiovascular: NS1&S2, no murmurs  Respiratory: less diffuse wheezing b/l  Abdomen: soft, N,T ND,BS+  Musculoskeletal: warm, no edema   Data Reviewed: Basic Metabolic Panel:  Recent Labs Lab 02/29/16 1654 03/01/16 0205 03/01/16 0552 03/03/16 0545  NA 142  --  142 140  K 4.1  --  3.8 4.6  CL 104  --  102 103  CO2 26  --  25 27  GLUCOSE 293*  --  359* 358*  BUN 9  --  10 17  CREATININE 0.64 0.75 0.80 0.75  CALCIUM 9.4  --  9.7 9.3   Liver Function Tests: No results for input(s): AST, ALT, ALKPHOS, BILITOT, PROT, ALBUMIN in the last 168 hours. No results for input(s): LIPASE, AMYLASE in the last 168 hours. No results for input(s): AMMONIA in the last 168 hours. CBC:  Recent Labs Lab 02/29/16 1654 03/01/16 0205 03/01/16 0552 03/03/16 0545  WBC 5.1 6.7 5.9 4.5  HGB 10.8* 10.5* 11.1* 10.1*  HCT 34.9* 34.0* 35.2* 33.0*  MCV 76.4* 76.9* 76.7* 76.7*  PLT 180 195 204 205   Cardiac Enzymes: No results for input(s): CKTOTAL, CKMB,  CKMBINDEX, TROPONINI in the last 168 hours. BNP (last 3 results)  Recent Labs  02/29/16 1703  BNP 52.9    ProBNP (last 3 results) No results for input(s): PROBNP in the last 8760 hours.  CBG:  Recent Labs Lab 03/01/16 2136 03/02/16 0817 03/02/16 1123 03/02/16 1721 03/02/16 2143  GLUCAP 287* 306* 403* 170* 246*    Recent Results (from the past 240 hour(s))  MRSA PCR Screening     Status: None   Collection Time: 03/02/16  8:14 PM  Result Value Ref Range Status   MRSA by PCR NEGATIVE NEGATIVE Final    Comment:        The GeneXpert MRSA Assay (FDA approved for NASAL  specimens only), is one component of a comprehensive MRSA colonization surveillance program. It is not intended to diagnose MRSA infection nor to guide or monitor treatment for MRSA infections.   Culture, expectorated sputum-assessment     Status: None   Collection Time: 03/02/16  8:32 PM  Result Value Ref Range Status   Specimen Description SPUTUM  Final   Special Requests NONE  Final   Sputum evaluation   Final    THIS SPECIMEN IS ACCEPTABLE. RESPIRATORY CULTURE REPORT TO FOLLOW.   Report Status 03/02/2016 FINAL  Final     Studies: No results found.  Scheduled Meds: . aspirin EC  81 mg Oral Daily  . doxycycline  100 mg Oral Q12H  . enoxaparin (LOVENOX) injection  40 mg Subcutaneous Q24H  . guaiFENesin  600 mg Oral BID  . insulin aspart  0-15 Units Subcutaneous TID WC  . insulin aspart  0-5 Units Subcutaneous QHS  . insulin glargine  10 Units Subcutaneous BID  . ipratropium-albuterol  3 mL Nebulization Q6H  . losartan  50 mg Oral Daily  . methylPREDNISolone (SOLU-MEDROL) injection  60 mg Intravenous 3 times per day  . mometasone-formoterol  2 puff Inhalation BID  . NIFEdipine  30 mg Oral Daily  . pantoprazole  40 mg Oral Daily  . senna-docusate  2 tablet Oral BID   Continuous Infusions:     Time spent: 35 minutes    Jaylina Ramdass MD PhD [facility] Pager [phone] If 7PM-7AM, please contact night-coverage at [email or web address], password [facility] 03/03/2016, 8:22 AM  LOS: 1 day

## 2016-03-04 ENCOUNTER — Inpatient Hospital Stay (HOSPITAL_COMMUNITY): Payer: Medicare (Managed Care)

## 2016-03-04 LAB — BASIC METABOLIC PANEL WITH GFR
Anion gap: 11 (ref 5–15)
BUN: 21 mg/dL — ABNORMAL HIGH (ref 6–20)
CO2: 25 mmol/L (ref 22–32)
Calcium: 9.5 mg/dL (ref 8.9–10.3)
Chloride: 103 mmol/L (ref 101–111)
Creatinine, Ser: 0.82 mg/dL (ref 0.44–1.00)
GFR calc Af Amer: >60 mL/min
GFR calc non Af Amer: >60 mL/min
Glucose, Bld: 349 mg/dL — ABNORMAL HIGH (ref 65–99)
Potassium: 4.7 mmol/L (ref 3.5–5.1)
Sodium: 139 mmol/L (ref 135–145)

## 2016-03-04 LAB — MAGNESIUM: Magnesium: 2.2 mg/dL (ref 1.7–2.4)

## 2016-03-04 LAB — GLUCOSE, CAPILLARY
GLUCOSE-CAPILLARY: 255 mg/dL — AB (ref 65–99)
GLUCOSE-CAPILLARY: 368 mg/dL — AB (ref 65–99)
Glucose-Capillary: 302 mg/dL — ABNORMAL HIGH (ref 65–99)

## 2016-03-04 MED ORDER — MAGNESIUM SULFATE 2 GM/50ML IV SOLN
2.0000 g | Freq: Once | INTRAVENOUS | Status: AC
  Administered 2016-03-04: 2 g via INTRAVENOUS
  Filled 2016-03-04: qty 50

## 2016-03-04 MED ORDER — INSULIN GLARGINE 100 UNIT/ML ~~LOC~~ SOLN
15.0000 [IU] | Freq: Two times a day (BID) | SUBCUTANEOUS | Status: DC
  Administered 2016-03-04: 10 [IU] via SUBCUTANEOUS
  Administered 2016-03-04 – 2016-03-06 (×4): 15 [IU] via SUBCUTANEOUS
  Filled 2016-03-04 (×6): qty 0.15

## 2016-03-04 MED ORDER — ACETYLCYSTEINE 20 % IN SOLN
2.0000 mL | Freq: Four times a day (QID) | RESPIRATORY_TRACT | Status: AC
  Administered 2016-03-04 – 2016-03-05 (×2): 2 mL via RESPIRATORY_TRACT
  Filled 2016-03-04 (×3): qty 4

## 2016-03-04 MED ORDER — ACETYLCYSTEINE 10 % IN SOLN
4.0000 mL | Freq: Four times a day (QID) | RESPIRATORY_TRACT | Status: DC
Start: 2016-03-04 — End: 2016-03-04
  Filled 2016-03-04: qty 4

## 2016-03-04 NOTE — Progress Notes (Signed)
[facility] PROGRESS NOTE  Dominique Hurst ZOX:[id number] DOB: 30-May-1941 DOA: 02/29/2016 PCP: in [place], [place]  75 y/o female resident of [place] with hx fo asthma, ? COPD CAD and HTN admitted with severeal days of cough and dyspnea on exertion. Pt admission with asthma  vs COPD exacerbation.   Assessment/Plan: Acute asthma /? COPD exacerbation  Has hx of intubation. Reports she's working on getting nebs in [place]. negative flu pcr, respiratory viral panel pending, sputum culture pending, urine strep pneumo antigen negative, legionella antigen pending, mrsa screen negative.  4/14 Significant diffuse bilateral wheezing, no improvement on current regimen, will change oral steroids to iv solumedrol, change albuterol to duoneb, add iv magx1 Avoided quinolones or macrolides due to prolonged Qtc. Continue doxycycline.   4/16: persistent bilateral wheezing, cough, will add mucomyst nebs, additional dose of iv mag, repeat cxr  Chronic diastolic CHF:  Only EF was in 8119 on LHC, documented here. euvolemic. normal BNP.   History of coronary disease and HTN:  Previous LHC x2 but no stent. -Continue home nifedipine, losartan and aspirin  Hyperglycemia ? Due to steroids  no hx of DM. Monitor on SSI. A1C 10.2, increase lantus to 15 units bid while on iv steroids, will need to discharge on insulin/metformin, patient agreed to it.    Microcytic anemia:  H&h stable  QTc prolongation: -start tele, QTc improved from 505 ms to , monitor     DVT PPx: Lovenox Diet: Diabetic, heart healthy  Code Status: full code Family Communication: none at bedside Disposition Plan: home when medically stable. Plans to go back to [place]    Consultants:  none  Procedures:  none  Antibiotics:  Doxycycline 4/13--  HPI/Subjective: On 2liter oxygen, persistent wheezing , cough, does not seem in respiratory distress,   Objective: Filed Vitals:   03/03/16 1355 03/03/16 2203  BP: 142/89  163/71  Pulse: 67 81  Temp: 98.3 F (36.8 C) 98.4 F (36.9 C)  Resp: 17 18   No intake or output data in the 24 hours ending 03/04/16 0829 Filed Weights   02/29/16 1652 03/01/16 0101  Weight: 78.019 kg (172 lb) 76.5 kg (168 lb 10.4 oz)    Exam:   General: not in distress, coughing  HEENT: no pallor, moist mucosa  Cardiovascular: NS1&S2, no murmurs  Respiratory: diffuse wheezing b/l  Abdomen: soft, N,T ND,BS+  Musculoskeletal: warm, no edema   Data Reviewed: Basic Metabolic Panel:  Recent Labs Lab 02/29/16 1654 03/01/16 0205 03/01/16 0552 03/03/16 0545 03/04/16 0356  NA 142  --  142 140 139  K 4.1  --  3.8 4.6 4.7  CL 104  --  102 103 103  CO2 26  --  GLUCOSE 293*  --  359* 358* 349*  BUN 9  --  10 17 21*  CREATININE 0.64 0.75 0.80 0.75 0.82  CALCIUM 9.4  --  9.7 9.3 9.5  MG  --   --   --   --  2.2   Liver Function Tests: No results for input(s): AST, ALT, ALKPHOS, BILITOT, PROT, ALBUMIN in the last 168 hours. No results for input(s): LIPASE, AMYLASE in the last 168 hours. No results for input(s): AMMONIA in the last 168 hours. CBC:  Recent Labs Lab 02/29/16 1654 03/01/16 0205 03/01/16 0552 03/03/16 0545  WBC 5.1 6.7 5.9 4.5  HGB 10.8* 10.5* 11.1* 10.1*  HCT 34.9* 34.0* 35.2* 33.0*  MCV 76.4* 76.9* 76.7* 76.7*  PLT 180 195 204 205  Cardiac Enzymes: No results for input(s): CKTOTAL, CKMB, CKMBINDEX, TROPONINI in the last 168 hours. BNP (last 3 results)  Recent Labs  02/29/16 1703  BNP 52.9    ProBNP (last 3 results) No results for input(s): PROBNP in the last 8760 hours.  CBG:  Recent Labs Lab 03/03/16 1330 03/03/16 1331 03/03/16 1725 03/03/16 2128 03/04/16 0734  GLUCAP 411* 355* 299* 203* 255*    Recent Results (from the past 240 hour(s))  MRSA PCR Screening     Status: None   Collection Time: 03/02/16  8:14 PM  Result Value Ref Range Status   MRSA by PCR NEGATIVE NEGATIVE Final    Comment:        The GeneXpert  MRSA Assay (FDA approved for NASAL specimens only), is one component of a comprehensive MRSA colonization surveillance program. It is not intended to diagnose MRSA infection nor to guide or monitor treatment for MRSA infections.   Culture, expectorated sputum-assessment     Status: None   Collection Time: 03/02/16  8:32 PM  Result Value Ref Range Status   Specimen Description SPUTUM  Final   Special Requests NONE  Final   Sputum evaluation   Final    THIS SPECIMEN IS ACCEPTABLE. RESPIRATORY CULTURE REPORT TO FOLLOW.   Report Status 03/02/2016 FINAL  Final  Culture, respiratory (NON-Expectorated)     Status: None (Preliminary result)   Collection Time: 03/02/16  8:32 PM  Result Value Ref Range Status   Specimen Description SPUTUM  Final   Special Requests NONE  Final   Gram Stain   Final    MODERATE WBC PRESENT, PREDOMINANTLY PMN FEW SQUAMOUS EPITHELIAL CELLS PRESENT NO ORGANISMS SEEN THIS SPECIMEN IS ACCEPTABLE FOR SPUTUM CULTURE Performed at [facility]    Culture PENDING  Incomplete   Report Status PENDING  Incomplete     Studies: No results found.  Scheduled Meds: . acetylcysteine  4 mL Nebulization Q6H  . aspirin EC  81 mg Oral Daily  . doxycycline  100 mg Oral Q12H  . enoxaparin (LOVENOX) injection  40 mg Subcutaneous Q24H  . guaiFENesin  600 mg Oral BID  . insulin aspart  0-15 Units Subcutaneous TID WC  . insulin aspart  0-5 Units Subcutaneous QHS  . insulin glargine  10 Units Subcutaneous BID  . ipratropium-albuterol  3 mL Nebulization Q6H  . losartan  50 mg Oral Daily  . magnesium sulfate 1 - 4 g bolus IVPB  2 g Intravenous Once  . methylPREDNISolone (SOLU-MEDROL) injection  60 mg Intravenous 3 times per day  . mometasone-formoterol  2 puff Inhalation BID  . NIFEdipine  30 mg Oral Daily  . pantoprazole  40 mg Oral Daily  . senna-docusate  2 tablet Oral BID   Continuous Infusions:     Time spent: 35 minutes    Crespin Forstrom MD PhD [facility] Pager [phone] If 7PM-7AM, please contact night-coverage at [email or web address], password [email or web address] 03/04/2016, 8:29 AM  LOS: 2 days

## 2016-03-05 DIAGNOSIS — J45901 Unspecified asthma with (acute) exacerbation: Principal | ICD-10-CM

## 2016-03-05 DIAGNOSIS — J410 Simple chronic bronchitis: Secondary | ICD-10-CM

## 2016-03-05 LAB — LEGIONELLA PNEUMOPHILA SEROGP 1 UR AG: L. PNEUMOPHILA SEROGP 1 UR AG: NEGATIVE

## 2016-03-05 LAB — BASIC METABOLIC PANEL
Anion gap: 10 (ref 5–15)
BUN: 18 mg/dL (ref 6–20)
CHLORIDE: 102 mmol/L (ref 101–111)
CO2: 29 mmol/L (ref 22–32)
CREATININE: 0.91 mg/dL (ref 0.44–1.00)
Calcium: 9.5 mg/dL (ref 8.9–10.3)
GFR calc Af Amer: >60 mL/min (ref >60)
GFR calc non Af Amer: >60 mL/min (ref >60)
Glucose, Bld: 260 mg/dL — ABNORMAL HIGH (ref 65–99)
Potassium: 4.7 mmol/L (ref 3.5–5.1)
SODIUM: 141 mmol/L (ref 135–145)

## 2016-03-05 LAB — CULTURE, RESPIRATORY W GRAM STAIN

## 2016-03-05 LAB — GLUCOSE, CAPILLARY
GLUCOSE-CAPILLARY: 218 mg/dL — AB (ref 65–99)
GLUCOSE-CAPILLARY: 174 mg/dL — AB (ref 65–99)
GLUCOSE-CAPILLARY: 206 mg/dL — AB (ref 65–99)
Glucose-Capillary: 318 mg/dL — ABNORMAL HIGH (ref 65–99)
Glucose-Capillary: 407 mg/dL — ABNORMAL HIGH (ref 65–99)

## 2016-03-05 LAB — CULTURE, RESPIRATORY: CULTURE: NORMAL

## 2016-03-05 LAB — PROCALCITONIN: Procalcitonin: <0.1 ng/mL

## 2016-03-05 MED ORDER — INSULIN GLARGINE 100 UNIT/ML ~~LOC~~ SOLN
15.0000 [IU] | Freq: Once | SUBCUTANEOUS | Status: AC
  Administered 2016-03-05: 15 [IU] via SUBCUTANEOUS
  Filled 2016-03-05: qty 0.15

## 2016-03-05 MED ORDER — INSULIN STARTER KIT- SYRINGES (ENGLISH)
1.0000 | Freq: Once | Status: DC
  Filled 2016-03-05: qty 1

## 2016-03-05 MED ORDER — METHYLPREDNISOLONE SODIUM SUCC 40 MG IJ SOLR
40.0000 mg | Freq: Two times a day (BID) | INTRAMUSCULAR | Status: DC
  Administered 2016-03-05 – 2016-03-06 (×2): 40 mg via INTRAVENOUS
  Filled 2016-03-05 (×2): qty 1

## 2016-03-05 NOTE — Progress Notes (Signed)
Physical Therapy Treatment Note  Clinical Impression: SATURATION QUALIFICATIONS: (This note is used to comply with regulatory documentation for home oxygen)  Patient Saturations on Room Air at Rest = 94%  Patient Saturations on Room Air while Ambulating = 87%  Patient Saturations on 2 Liters of oxygen while Ambulating = 94%  Please briefly explain why patient needs home oxygen: Patient requires supplemental oxygen to maintain oxygen saturations at acceptable, safe levels with physical activity.    Van ClinesHolly Koleson Reifsteck, [place]  Acute Rehabilitation Services Pager [phone] Office [phone]

## 2016-03-05 NOTE — Progress Notes (Signed)
Physical Therapy Treatment Patient Details Name: Caren MacadamJudith Rief MRN: [id number] DOB: November 24, 1940 Today's Date: 03/05/2016    History of Present Illness Patient is a 75 yo female admitted 02/29/16 with asthma exacerbation.   PMH:  asthma, COPD, CAD, HTN, CHF    PT Comments    Making progress with functional mobility and activity tolerance; Cues to self-monitor for activity tolerance; see other note of this date for O2 sat qualifying walk   Follow Up Recommendations  Home health PT;Supervision for mobility/OOB     Equipment Recommendations  None recommended by PT    Recommendations for Other Services       Precautions / Restrictions Precautions Precautions: Fall    Mobility  Bed Mobility                  Transfers Overall transfer level: Needs assistance Equipment used: Rolling walker (2 wheeled) Transfers: Sit to/from Stand Sit to Stand: Supervision         General transfer comment: Assist for safety  Ambulation/Gait Ambulation/Gait assistance: Min guard Ambulation Distance (Feet): 55 Feet Assistive device: Rolling walker (2 wheeled) Gait Pattern/deviations: Step-through pattern;Decreased stride length;Trunk flexed Gait velocity: decreased   General Gait Details: Demonstrates safe use of RW.  Patient with DOE during gait, took 1 standing rest break with forearms propped on RW   Stairs            Wheelchair Mobility    Modified Rankin (Stroke Patients Only)       Balance             Standing balance-Leahy Scale: Fair                      Cognition Arousal/Alertness: Awake/alert Behavior During Therapy: WFL for tasks assessed/performed Overall Cognitive Status: Within Functional Limits for tasks assessed                      Exercises      General Comments        Pertinent Vitals/Pain Pain Assessment: 0-10 Pain Score: 6  Pain Location: R hip Pain Descriptors / Indicators: Aching    Home Living                       Prior Function            PT Goals (current goals can now be found in the care plan section) Acute Rehab PT Goals Patient Stated Goal: To return home PT Goal Formulation: With patient Time For Goal Achievement: 03/10/16 Potential to Achieve Goals: Good Progress towards PT goals: Progressing toward goals    Frequency  Min 3X/week    PT Plan Current plan remains appropriate    Co-evaluation             End of Session Equipment Utilized During Treatment: Gait belt;Oxygen Activity Tolerance: Patient tolerated treatment well;Patient limited by fatigue Patient left: in chair;with call bell/phone within reach     Time: [id number] PT Time Calculation (min) (ACUTE ONLY): 17 min  Charges:  $Gait Training: 8-22 mins                    G Codes:      Olen PelGarrigan, Christhoper Busbee Hamff 03/05/2016, 4:16 PM  Van ClinesHolly Stephane Niemann, [place]  Acute Rehabilitation Services Pager [phone] Office [phone]

## 2016-03-05 NOTE — Progress Notes (Signed)
[facility] PROGRESS NOTE  Dominique Hurst ZOX:[id number] DOB: 1940-11-26 DOA: 02/29/2016 PCP: in [place], [place]  75 y/o female resident of [place] with hx fo asthma, ? COPD CAD and HTN admitted with severeal days of cough and dyspnea on exertion. Pt admission with asthma  vs COPD exacerbation.   Assessment/Plan: Acute asthma /? COPD exacerbation  Has hx of intubation. Reports she's working on getting nebs in [place]. negative flu pcr, respiratory viral panel pending, sputum culture pending, urine strep pneumo antigen negative, legionella antigen pending, mrsa screen negative. Some improvement in the patient's wheezing, changed oral steroids to iv solumedrol 4/14, continue to taper steroids Avoided quinolones or macrolides due to prolonged Qtc. Continue doxycycline. Continue to taper her IV Solu-Medrol  Chronic diastolic CHF: Without exacerbation Only EF was in 2012 on LHC, documented here. euvolemic. normal BNP.   History of coronary disease and HTN:  Previous LHC x2 but no stent. -Continue home nifedipine, losartan and aspirin  Hyperglycemia ? Due to steroids  no hx of DM. Monitor on SSI. A1C 10.2, continue lantus to 15 units bid while on iv steroids, will need to discharge on insulin/metformin,     Microcytic anemia:  H&h stable  QTc prolongation: -start tele, QTc improved from 505 ms to , monitor     DVT PPx: Lovenox Diet: Diabetic, heart healthy  Code Status: full code Family Communication: none at bedside Disposition Plan: Anticipate discharge tomorrow with home health   Consultants:  none  Procedures:  none  Antibiotics:  Doxycycline 4/13--  HPI/Subjective: On 2liter oxygen,continues to have shallow breathing, somewhat better since yesterday, no longer wheezing  Objective: Filed Vitals:   03/04/16 2131 03/05/16 0537  BP: 160/75 150/82  Pulse: 75   Temp: 98.2 F (36.8 C) 98.2 F (36.8 C)  Resp: 19 20    Intake/Output Summary (Last 24 hours)  at 03/05/16 0819 Last data filed at 03/05/16 0500  Gross per 24 hour  Intake   1220 ml  Output    901 ml  Net    319 ml   Filed Weights   02/29/16 1652 03/01/16 0101  Weight: 78.019 kg (172 lb) 76.5 kg (168 lb 10.4 oz)    Exam:   General: not in distress, coughing  HEENT: no pallor, moist mucosa  Cardiovascular: NS1&S2, no murmurs  Respiratory: diffuse wheezing b/l  Abdomen: soft, N,T ND,BS+  Musculoskeletal: warm, no edema   Data Reviewed: Basic Metabolic Panel:  Recent Labs Lab 02/29/16 1654 03/01/16 0205 03/01/16 0552 03/03/16 0545 03/04/16 0356 03/05/16 0528  NA 142  --  142 140 139 141  K 4.1  --  3.8 4.6 4.7 4.7  CL 104  --  102 103 103 102  CO2 26  --  GLUCOSE 293*  --  359* 358* 349* 260*  BUN 9  --  10 17 21* 18  CREATININE 0.64 0.75 0.80 0.75 0.82 0.91  CALCIUM 9.4  --  9.7 9.3 9.5 9.5  MG  --   --   --   --  2.2  --    Liver Function Tests: No results for input(s): AST, ALT, ALKPHOS, BILITOT, PROT, ALBUMIN in the last 168 hours. No results for input(s): LIPASE, AMYLASE in the last 168 hours. No results for input(s): AMMONIA in the last 168 hours. CBC:  Recent Labs Lab 02/29/16 1654 03/01/16 0205 03/01/16 0552 03/03/16 0545  WBC 5.1 6.7 5.9 4.5  HGB 10.8* 10.5* 11.1* 10.1*  HCT 34.9* 34.0*  35.2* 33.0*  MCV 76.4* 76.9* 76.7* 76.7*  PLT 180 195 204 205   Cardiac Enzymes: No results for input(s): CKTOTAL, CKMB, CKMBINDEX, TROPONINI in the last 168 hours. BNP (last 3 results)  Recent Labs  02/29/16 1703  BNP 52.9    ProBNP (last 3 results) No results for input(s): PROBNP in the last 8760 hours.  CBG:  Recent Labs Lab 03/03/16 2128 03/04/16 0734 03/04/16 1156 03/04/16 1649 03/04/16 2127  GLUCAP 203* 255* 302* 368* 218*    Recent Results (from the past 240 hour(s))  MRSA PCR Screening     Status: None   Collection Time: 03/02/16  8:14 PM  Result Value Ref Range Status   MRSA by PCR NEGATIVE NEGATIVE Final     Comment:        The GeneXpert MRSA Assay (FDA approved for NASAL specimens only), is one component of a comprehensive MRSA colonization surveillance program. It is not intended to diagnose MRSA infection nor to guide or monitor treatment for MRSA infections.   Culture, expectorated sputum-assessment     Status: None   Collection Time: 03/02/16  8:32 PM  Result Value Ref Range Status   Specimen Description SPUTUM  Final   Special Requests NONE  Final   Sputum evaluation   Final    THIS SPECIMEN IS ACCEPTABLE. RESPIRATORY CULTURE REPORT TO FOLLOW.   Report Status 03/02/2016 FINAL  Final  Culture, respiratory (NON-Expectorated)     Status: None (Preliminary result)   Collection Time: 03/02/16  8:32 PM  Result Value Ref Range Status   Specimen Description SPUTUM  Final   Special Requests NONE  Final   Gram Stain   Final    MODERATE WBC PRESENT, PREDOMINANTLY PMN FEW SQUAMOUS EPITHELIAL CELLS PRESENT NO ORGANISMS SEEN THIS SPECIMEN IS ACCEPTABLE FOR SPUTUM CULTURE Performed at [facility]    Culture   Final    Culture reincubated for better growth Performed at [facility]    Report Status PENDING  Incomplete     Studies: Dg Chest 2 View  03/04/2016  CLINICAL DATA:  75 year old female with history of wheezing, asthma and cough for the past several weeks. EXAM: CHEST  2 VIEW COMPARISON:  Chest x-ray 02/29/2016. FINDINGS: Mild diffuse peribronchial cuffing. Lung volumes are normal. No consolidative airspace disease. No pleural effusions. No pneumothorax. No pulmonary nodule or mass noted. Pulmonary vasculature is normal. Heart size is mildly enlarged. Upper mediastinal contours are within normal limits. Atherosclerosis in the thoracic aorta. IMPRESSION: 1. Mild diffuse peribronchial cuffing. This could be related to reactive airway disease, or could indicate an acute bronchitis. Clinical correlation is recommended. No hyperexpansion of the lungs to suggest asthma  exacerbation at this time. 2. Mild cardiomegaly. 3. Atherosclerosis. Electronically Signed   By: Trudie Reedaniel  Entrikin M.D.   On: 03/04/2016 10:49    Scheduled Meds: . aspirin EC  81 mg Oral Daily  . doxycycline  100 mg Oral Q12H  . enoxaparin (LOVENOX) injection  40 mg Subcutaneous Q24H  . guaiFENesin  600 mg Oral BID  . insulin aspart  0-15 Units Subcutaneous TID WC  . insulin aspart  0-5 Units Subcutaneous QHS  . insulin glargine  15 Units Subcutaneous BID  . ipratropium-albuterol  3 mL Nebulization Q6H  . losartan  50 mg Oral Daily  . methylPREDNISolone (SOLU-MEDROL) injection  60 mg Intravenous 3 times per day  . mometasone-formoterol  2 puff Inhalation BID  . NIFEdipine  30 mg Oral Daily  . pantoprazole  40 mg Oral Daily  . senna-docusate  2 tablet Oral BID   Continuous Infusions:     Time spent: 35 minutes    Ghalia Reicks MD  [facility] Pager [phone] If 7PM-7AM, please contact night-coverage at [email or web address], password [facility] 03/05/2016, 8:19 AM  LOS: 3 days

## 2016-03-05 NOTE — Care Management (Signed)
Oxygen order signed by MD . [facility] , to have oxygen tank delivered to patient's hospital room tomorrow am.   Ronny FlurryHeather Dierdra Salameh RN BSN [phone]

## 2016-03-05 NOTE — Care Management Note (Signed)
Case Management Note  Patient Details  Name: Dominique Hurst MRN: [id number] Date of Birth: 05/24/1941  Subjective/Objective:                    Action/Plan:  Spoke with patient regarding home oxygen . Also spoke with patient's daughter Dominique Hurst [phone] via phone .   Plan to discharge patient tomorrow 03-06-16 with home oxygen . Patient plans to stay with her daughter until Monday 03-16-16 [place] .   [facility] [place] able to [place] oxygen [place] at daughter address : [address] [phone] , and then when patient returns to [address], [address], [place] [phone] [facility] over to their closest office. Patient and daughter aware.   Spoke with Dr Susie CassetteAbrol and request home oxygen order entered today to start process.   Patient's PCP is : DR Royce MacadamiaErnest Afflu  [address] , [place] , [place] [id number] , phone [phone]  Expected Discharge Date:                  Expected Discharge Plan:  Home/Self Care  In-House Referral:     Discharge planning Services  CM Consult  Post Acute Care Choice:  Durable Medical Equipment Choice offered to:  Patient, Adult Children  DME Arranged:  [facility]xygen [facility]  HH Arranged:    HH Agency:     Status of Service:  In process, will continue to follow  Medicare Important Message Given:    Date Medicare IM Given:    Medicare IM give by:    Date Additional Medicare IM Given:    Additional Medicare Important Message give by:     If discussed at Long Length of Stay Meetings, dates discussed:    Additional Comments:  Kingsley PlanWile, Shrihan Putt Marie, RN 03/05/2016, 3:18 PM

## 2016-03-05 NOTE — Progress Notes (Signed)
SATURATION QUALIFICATIONS: (This note is used to comply with regulatory documentation for home oxygen)  Patient Saturations on Room Air at Rest = 96%  Patient Saturations on Room Air while Ambulating = 86%  Patient Saturations on 0 Liters of oxygen while Ambulating = 97%  Please briefly explain why patient needs home oxygen:desats on room air.

## 2016-03-05 NOTE — Progress Notes (Signed)
Inpatient Diabetes Program Recommendations  AACE/ADA: New Consensus Statement on Inpatient Glycemic Control (2015)  Target Ranges:  Prepandial:   less than 140 mg/dL      Peak postprandial:   less than 180 mg/dL (1-2 hours)      Critically ill patients:  140 - 180 mg/dL   Results for Dominique Hurst, Dominique Hurst (MRN [id number]) as of 03/05/2016 14:38  Ref. Range 03/04/2016 07:34 03/04/2016 11:56 03/04/2016 16:49 03/04/2016 21:27  Glucose-Capillary Latest Ref Range: 65-99 mg/dL 255 (H) 302 (H) 368 (H) 218 (H)   Results for [name], [name] (MRN [id number]) as of 03/05/2016 14:38  Ref. Range 03/05/2016 08:08 03/05/2016 11:46  Glucose-Capillary Latest Ref Range: 65-99 mg/dL 318 (H) 407 (H)    Admit with: SOB  History: DM2, CHF, COPD  Home DM Meds: Glipizide 10 mg bid       Metformin 500 mg bid  Current Insulin Orders: Lantus 15 units bid      Novolog Moderate Correction Scale/ SSI (0-15 units) TID AC + HS      -Note IV Solumedrol decreased from 60 mg tid to 40 mg bid today.  -Also note Lantus increased to 15 units bid today.  Patient got an extra 15 units Lantus at 2pm today since 12pm CBG up to 407 mg/dl.  -Spoke with patient about her current A1c of 10.2%.  Explained what an A1c is and what it measures.  Reminded patient that her goal A1c is 7% or less per ADA standards to prevent both acute and long-term complications.  Explained to patient the extreme importance of good glucose control at home.  Encouraged patient to check their CBGs at least bid at home (fasting and another check within the day) and to record all CBGs in a logbook for her PCP to review.  -Patient told me she got diabetes from all the steroids she has taken over the years.  Is willing to take insulin if necessary.  Stated to me that the steroids make her "sugars high" but that the steroids have kept her alive and that she is willing to do what she needs to do to stay alive.  -Have asked RNs caring for pt to begin insulin teaching with  patient with vial and syringe teaching kit.     --Will follow patient during hospitalization--  Wyn Quaker RN, MSN, CDE Diabetes Coordinator Inpatient Glycemic Control Team Team Pager: [phone] (8a-5p)

## 2016-03-06 DIAGNOSIS — J440 Chronic obstructive pulmonary disease with acute lower respiratory infection: Secondary | ICD-10-CM

## 2016-03-06 DIAGNOSIS — E119 Type 2 diabetes mellitus without complications: Secondary | ICD-10-CM

## 2016-03-06 LAB — RESPIRATORY VIRUS PANEL
Adenovirus: NEGATIVE
Influenza A: NEGATIVE
Influenza B: NEGATIVE
Metapneumovirus: POSITIVE — AB
PARAINFLUENZA 1 A: NEGATIVE
PARAINFLUENZA 2 A: NEGATIVE
PARAINFLUENZA 3 A: NEGATIVE
RESPIRATORY SYNCYTIAL VIRUS B: NEGATIVE
RHINOVIRUS: NEGATIVE
Respiratory Syncytial Virus A: NEGATIVE

## 2016-03-06 LAB — CBC
HEMATOCRIT: 34.9 % — AB (ref 36.0–46.0)
HEMOGLOBIN: 10.8 g/dL — AB (ref 12.0–15.0)
MCH: 23.6 pg — ABNORMAL LOW (ref 26.0–34.0)
MCHC: 30.9 g/dL (ref 30.0–36.0)
MCV: 76.4 fL — AB (ref 78.0–100.0)
PLATELETS: 195 10*3/uL (ref 150–400)
RBC: 4.57 MIL/uL (ref 3.87–5.11)
RDW: 15.3 % (ref 11.5–15.5)
WBC: 9 10*3/uL (ref 4.0–10.5)

## 2016-03-06 LAB — BASIC METABOLIC PANEL
ANION GAP: 12 (ref 5–15)
BUN: 17 mg/dL (ref 6–20)
CALCIUM: 9.4 mg/dL (ref 8.9–10.3)
CO2: 28 mmol/L (ref 22–32)
CREATININE: 0.81 mg/dL (ref 0.44–1.00)
Chloride: 103 mmol/L (ref 101–111)
GFR calc Af Amer: >60 mL/min (ref >60)
GLUCOSE: 139 mg/dL — AB (ref 65–99)
POTASSIUM: 4.1 mmol/L (ref 3.5–5.1)
Sodium: 143 mmol/L (ref 135–145)

## 2016-03-06 LAB — GLUCOSE, CAPILLARY: Glucose-Capillary: 115 mg/dL — ABNORMAL HIGH (ref 65–99)

## 2016-03-06 MED ORDER — GUAIFENESIN ER 600 MG PO TB12: 600.0000 mg | ORAL_TABLET | Freq: Two times a day (BID) | ORAL | Status: AC

## 2016-03-06 MED ORDER — FLUTICASONE-SALMETEROL 100-50 MCG/DOSE IN AEPB: 1.0000 | INHALATION_SPRAY | Freq: Two times a day (BID) | RESPIRATORY_TRACT | Status: AC

## 2016-03-06 MED ORDER — ALBUTEROL SULFATE HFA 108 (90 BASE) MCG/ACT IN AERS: 1.0000 | INHALATION_SPRAY | Freq: Four times a day (QID) | RESPIRATORY_TRACT | Status: AC | PRN

## 2016-03-06 MED ORDER — PREDNISONE 10 MG PO TABS: ORAL_TABLET | ORAL | Status: AC

## 2016-03-06 MED ORDER — DOXYCYCLINE HYCLATE 100 MG PO TABS: 100.0000 mg | ORAL_TABLET | Freq: Two times a day (BID) | ORAL | Status: AC

## 2016-03-06 NOTE — Discharge Instructions (Signed)
Follow up appointment with DR Royce MacadamiaErnest Afflu  [address] , [place] , [place] [id number] , phone [phone]   Mar 24, 2016 at 0930 am   MD office aware home health was ordered and need [place] MD to order and arrange .

## 2016-03-06 NOTE — Discharge Summary (Signed)
Physician Discharge Summary  Dominique Hurst MRN: [id number] DOB/AGE: [birth year] 75 y.o.  PCP: No primary care provider on file.   Admit date: 02/29/2016 Discharge date: 03/06/2016  Discharge Diagnoses:     Principal Problem:   Asthma exacerbation Active Problems:   Diabetes mellitus without complication (HCC)   COPD (chronic obstructive pulmonary disease) (HCC)   Prolonged QT interval   Microcytic anemia   Coronary arteriosclerosis due to lipid rich plaque   Metapneumovirus Bronchitis      Follow-up recommendations Follow-up with PCP in 3-5 days , including all  additional recommended appointments as below Follow-up CBC, CMP in 3-5 days Follow up appointment with DR Anson Fret  [address] , [place] [address] , phone [phone]   Mar 24, 2016 at 0930      Medication List    TAKE these medications        acetaminophen 500 MG tablet  Commonly known as:  TYLENOL  Take 500-1,000 mg by mouth every 6 (six) hours as needed for moderate pain or headache.     albuterol 108 (90 Base) MCG/ACT inhaler  Commonly known as:  PROVENTIL HFA;VENTOLIN HFA  Inhale 1-2 puffs into the lungs every 6 (six) hours as needed for wheezing or shortness of breath.     aspirin EC 81 MG tablet  Take 81 mg by mouth daily.     doxycycline 100 MG tablet  Commonly known as:  VIBRA-TABS  Take 1 tablet (100 mg total) by mouth every 12 (twelve) hours.     Fluticasone-Salmeterol 100-50 MCG/DOSE Aepb  Commonly known as:  ADVAIR  Inhale 1 puff into the lungs 2 (two) times daily.     glipiZIDE 10 MG 24 hr tablet  Commonly known as:  GLUCOTROL XL  Take 10 mg by mouth 2 (two) times daily.     guaiFENesin 600 MG 12 hr tablet  Commonly known as:  MUCINEX  Take 1 tablet (600 mg total) by mouth 2 (two) times daily.     losartan 50 MG tablet  Commonly known as:  COZAAR  Take 50 mg by mouth daily as needed (only takes when not taking procardia).     metFORMIN 500 MG tablet  Commonly known  as:  GLUCOPHAGE  Take 500 mg by mouth 2 (two) times daily with a meal.     NIFEdipine 30 MG 24 hr tablet  Commonly known as:  PROCARDIA-XL/ADALAT-CC/NIFEDICAL-XL  Take 30 mg by mouth daily.     nitroGLYCERIN 0.4 MG SL tablet  Commonly known as:  NITROSTAT  Place 0.4 mg under the tongue every 5 (five) minutes as needed for chest pain.     omeprazole 20 MG capsule  Commonly known as:  PRILOSEC  Take 20 mg by mouth daily.     predniSONE 10 MG tablet  Commonly known as:  DELTASONE  6 tablets 5 days, 5 tablets 5 days, 4 tablets 5 days, 3 tablets 5 days, 2 tablets 5 days, 1 tablet 5 days then discontinue     Vitamin D (Ergocalciferol) 50000 units Caps capsule  Commonly known as:  DRISDOL  Take 50,000 Units by mouth every 7 (seven) days.         Discharge Condition: Stable Discharge Instructions Get Medicines reviewed and adjusted: Please take all your medications with you for your next visit with your Primary MD  Please request your Primary MD to go over all hospital tests and procedure/radiological results at the follow up, please ask your Primary  MD to get all Hospital records sent to his/her office.  If you experience worsening of your admission symptoms, develop shortness of breath, life threatening emergency, suicidal or homicidal thoughts you must seek medical attention immediately by calling 911 or calling your MD immediately if symptoms less severe.  You must read complete instructions/literature along with all the possible adverse reactions/side effects for all the Medicines you take and that have been prescribed to you. Take any new Medicines after you have completely understood and accpet all the possible adverse reactions/side effects.   Do not drive when taking Pain medications.   Do not take more than prescribed Pain, Sleep and Anxiety Medications  Special Instructions: If you have smoked or chewed Tobacco in the last 2 yrs please stop smoking, stop any  regular Alcohol and or any Recreational drug use.  Wear Seat belts while driving.  Please note  You were cared for by a hospitalist during your hospital stay. Once you are discharged, your primary care physician will handle any further medical issues. Please note that NO REFILLS for any discharge medications will be authorized once you are discharged, as it is imperative that you return to your primary care physician (or establish a relationship with a primary care physician if you do not have one) for your aftercare needs so that they can reassess your need for medications and monitor your lab values.      Discharge Instructions    Diet - low sodium heart healthy    Complete by:  As directed      Increase activity slowly    Complete by:  As directed            No Known Allergies    Disposition: 01-Home or Self Care   Consults: None     Significant Diagnostic Studies:  Dg Chest 2 View  03/04/2016  CLINICAL DATA:  75 year old female with history of wheezing, asthma and cough for the past several weeks. EXAM: CHEST  2 VIEW COMPARISON:  Chest x-ray 02/29/2016. FINDINGS: Mild diffuse peribronchial cuffing. Lung volumes are normal. No consolidative airspace disease. No pleural effusions. No pneumothorax. No pulmonary nodule or mass noted. Pulmonary vasculature is normal. Heart size is mildly enlarged. Upper mediastinal contours are within normal limits. Atherosclerosis in the thoracic aorta. IMPRESSION: 1. Mild diffuse peribronchial cuffing. This could be related to reactive airway disease, or could indicate an acute bronchitis. Clinical correlation is recommended. No hyperexpansion of the lungs to suggest asthma exacerbation at this time. 2. Mild cardiomegaly. 3. Atherosclerosis. Electronically Signed   By: Vinnie Langton M.D.   On: 03/04/2016 10:49   Dg Chest 2 View  02/29/2016  CLINICAL DATA:  Shortness of breath with cough for 2 or 3 days. History of hypertension, COPD and  congestive heart failure. EXAM: CHEST  2 VIEW COMPARISON:  07/11/2011 and 05/10/2006. FINDINGS: The heart size and mediastinal contours are stable. There is chronic vascular congestion and mild atherosclerosis. No edema, confluent airspace opacity or pleural effusion. No acute osseous findings are seen. There are stable degenerative changes throughout the spine and at both glenohumeral joints. IMPRESSION: No acute cardiopulmonary process. Stable mild cardiomegaly and chronic vascular congestion. Electronically Signed   By: Richardean Sale M.D.   On: 02/29/2016 17:27        Filed Weights   02/29/16 1652 03/01/16 0101  Weight: 78.019 kg (172 lb) 76.5 kg (168 lb 10.4 oz)     Microbiology: Recent Results (from the past 240 hour(s))  MRSA PCR  Screening     Status: None   Collection Time: 03/02/16  8:14 PM  Result Value Ref Range Status   MRSA by PCR NEGATIVE NEGATIVE Final    Comment:        The GeneXpert MRSA Assay (FDA approved for NASAL specimens only), is one component of a comprehensive MRSA colonization surveillance program. It is not intended to diagnose MRSA infection nor to guide or monitor treatment for MRSA infections.   Respiratory virus panel     Status: Abnormal   Collection Time: 03/02/16  8:14 PM  Result Value Ref Range Status   Source - RVPAN NASAL SWAB  Corrected   Respiratory Syncytial Virus A Negative Negative Final   Respiratory Syncytial Virus B Negative Negative Final   Influenza A Negative Negative Final   Influenza B Negative Negative Final   Parainfluenza 1 Negative Negative Final   Parainfluenza 2 Negative Negative Final   Parainfluenza 3 Negative Negative Final   Metapneumovirus Positive (A) Negative Final    Comment:                   Client Requested Flag   Rhinovirus Negative Negative Final   Adenovirus Negative Negative Final    Comment: (NOTE) Performed At: [facility], [place] [id number] Lindon Romp MD  CV:[id number]   Culture, expectorated sputum-assessment     Status: None   Collection Time: 03/02/16  8:32 PM  Result Value Ref Range Status   Specimen Description SPUTUM  Final   Special Requests NONE  Final   Sputum evaluation   Final    THIS SPECIMEN IS ACCEPTABLE. RESPIRATORY CULTURE REPORT TO FOLLOW.   Report Status 03/02/2016 FINAL  Final  Culture, respiratory (NON-Expectorated)     Status: None   Collection Time: 03/02/16  8:32 PM  Result Value Ref Range Status   Specimen Description SPUTUM  Final   Special Requests NONE  Final   Gram Stain   Final    MODERATE WBC PRESENT, PREDOMINANTLY PMN FEW SQUAMOUS EPITHELIAL CELLS PRESENT NO ORGANISMS SEEN THIS SPECIMEN IS ACCEPTABLE FOR SPUTUM CULTURE Performed at [facility]    Culture   Final    NORMAL OROPHARYNGEAL FLORA Performed at [facility]    Report Status 03/05/2016 FINAL  Final       Blood Culture    Component Value Date/Time   SDES SPUTUM 03/02/2016 2032   SDES SPUTUM 03/02/2016 2032   [place] NONE 03/02/2016 2032   [place] NONE 03/02/2016 2032   CULT  03/02/2016 2032    NORMAL OROPHARYNGEAL FLORA Performed at [facility] 03/02/2016 FINAL 03/02/2016 2032   REPTSTATUS 03/05/2016 FINAL 03/02/2016 2032      Labs: Results for orders placed or performed during the hospital encounter of 02/29/16 (from the past 48 hour(s))  Glucose, capillary     Status: Abnormal   Collection Time: 03/04/16 11:56 AM  Result Value Ref Range   Glucose-Capillary 302 (H) 65 - 99 mg/dL   Comment 1 Notify RN   Glucose, capillary     Status: Abnormal   Collection Time: 03/04/16  4:49 PM  Result Value Ref Range   Glucose-Capillary 368 (H) 65 - 99 mg/dL   Comment 1 Notify RN   Glucose, capillary     Status: Abnormal   Collection Time: 03/04/16  9:27 PM  Result Value Ref Range   Glucose-Capillary 218 (H) 65 - 99 mg/dL   Comment 1 Notify  RN   Procalcitonin     Status: None    Collection Time: 03/05/16  5:28 AM  Result Value Ref Range   Procalcitonin <0.10 ng/mL    Comment:        Interpretation: PCT (Procalcitonin) <= 0.5 ng/mL: Systemic infection (sepsis) is not likely. Local bacterial infection is possible. (NOTE)         ICU PCT Algorithm               Non ICU PCT Algorithm    ----------------------------     ------------------------------         PCT < 0.25 ng/mL                 PCT < 0.1 ng/mL     Stopping of antibiotics            Stopping of antibiotics       strongly encouraged.               strongly encouraged.    ----------------------------     ------------------------------       PCT level decrease by               PCT < 0.25 ng/mL       >= 80% from peak PCT       OR PCT 0.25 - 0.5 ng/mL          Stopping of antibiotics                                             encouraged.     Stopping of antibiotics           encouraged.    ----------------------------     ------------------------------       PCT level decrease by              PCT >= 0.25 ng/mL       < 80% from peak PCT        AND PCT >= 0.5 ng/mL            Continuin g antibiotics                                              encouraged.       Continuing antibiotics            encouraged.    ----------------------------     ------------------------------     PCT level increase compared          PCT > 0.5 ng/mL         with peak PCT AND          PCT >= 0.5 ng/mL             Escalation of antibiotics                                          strongly encouraged.      Escalation of antibiotics        strongly encouraged.   Basic metabolic panel     Status: Abnormal   Collection Time: 03/05/16  5:28 AM  Result Value Ref  Range   Sodium 141 135 - 145 mmol/L   Potassium 4.7 3.5 - 5.1 mmol/L   Chloride 102 101 - 111 mmol/L   CO2 29 22 - 32 mmol/L   Glucose, Bld 260 (H) 65 - 99 mg/dL   BUN 18 6 - 20 mg/dL   Creatinine, Ser 0.91 0.44 - 1.00 mg/dL   Calcium 9.5 8.9 - 10.3 mg/dL   GFR  calc non Af Amer >60 >60 mL/min   GFR calc Af Amer >60 >60 mL/min    Comment: (NOTE) The eGFR has been calculated using the CKD EPI equation. This calculation has not been validated in all clinical situations. eGFR's persistently <60 mL/min signify possible Chronic Kidney Disease.    Anion gap 10 5 - 15  Glucose, capillary     Status: Abnormal   Collection Time: 03/05/16  8:08 AM  Result Value Ref Range   Glucose-Capillary 318 (H) 65 - 99 mg/dL  Glucose, capillary     Status: Abnormal   Collection Time: 03/05/16 11:46 AM  Result Value Ref Range   Glucose-Capillary 407 (H) 65 - 99 mg/dL  Glucose, capillary     Status: Abnormal   Collection Time: 03/05/16  5:37 PM  Result Value Ref Range   Glucose-Capillary 174 (H) 65 - 99 mg/dL  Glucose, capillary     Status: Abnormal   Collection Time: 03/05/16 10:32 PM  Result Value Ref Range   Glucose-Capillary 206 (H) 65 - 99 mg/dL  CBC     Status: Abnormal   Collection Time: 03/06/16  6:37 AM  Result Value Ref Range   WBC 9.0 4.0 - 10.5 K/uL   RBC 4.57 3.87 - 5.11 MIL/uL   Hemoglobin 10.8 (L) 12.0 - 15.0 g/dL   HCT 34.9 (L) 36.0 - 46.0 %   MCV 76.4 (L) 78.0 - 100.0 fL   MCH 23.6 (L) 26.0 - 34.0 pg   MCHC 30.9 30.0 - 36.0 g/dL   RDW 15.3 11.5 - 15.5 %   Platelets 195 150 - 400 K/uL  Basic metabolic panel     Status: Abnormal   Collection Time: 03/06/16  6:37 AM  Result Value Ref Range   Sodium 143 135 - 145 mmol/L   Potassium 4.1 3.5 - 5.1 mmol/L   Chloride 103 101 - 111 mmol/L   CO2 28 22 - 32 mmol/L   Glucose, Bld 139 (H) 65 - 99 mg/dL   BUN 17 6 - 20 mg/dL   Creatinine, Ser 0.81 0.44 - 1.00 mg/dL   Calcium 9.4 8.9 - 10.3 mg/dL   GFR calc non Af Amer >60 >60 mL/min   GFR calc Af Amer >60 >60 mL/min    Comment: (NOTE) The eGFR has been calculated using the CKD EPI equation. This calculation has not been validated in all clinical situations. eGFR's persistently <60 mL/min signify possible Chronic Kidney Disease.    Anion gap  12 5 - 15  Glucose, capillary     Status: Abnormal   Collection Time: 03/06/16  8:21 AM  Result Value Ref Range   Glucose-Capillary 115 (H) 65 - 99 mg/dL     Lipid Panel     Component Value Date/Time   CHOL 167 07/13/2011 0539   TRIG 59 07/13/2011 0539   HDL 64 07/13/2011 0539   CHOLHDL 2.6 07/13/2011 0539   VLDL 12 07/13/2011 0539   LDLCALC 91 07/13/2011 0539     Lab Results  Component Value Date   HGBA1C 10.2* 03/01/2016  HGBA1C 7.5* 07/12/2011     Lab Results  Component Value Date   LDLCALC 91 07/13/2011   CREATININE 0.81 03/06/2016     HPI :* 75 y.o. female with a past medical history significant for asthma, persistent severe, ?COPD, CAD, and HTN who presents with cough and worsening SOB over several days.  History is collected from EDP and patient. She reports several days worsening asthma symptoms, specifically increased cough, whitish sputum, and shortness of breath. These have not been associated with fever, chills, rigors, but have also not been completely relieved with her home albuterol inhaler, so she came to the ER.  In the ED, she had a low-grade temperature, tachycardia, tachypnea was otherwise hemodynamically stable and saturating well on room air. Na 142, K 4.1, Cr 0.6, glucose 293, WBC 5.1K, Hgb 10.8 microcytic, platelets normal. BNP normal and troponin negative. CXR without focal opacity or edema and ECG showed sinus tachycardia with old RBBB.   She was given Duo-nebs x2 by EMS and solumedrol, continuous albuterol, and magnesium in the ER and felt improvement. She lives in [place] and is visiting her daughter. She reported to pharmacy that she takes Advair, but to me she only knows of an albuterol inhaler. Denies smoking. She reports chronic severe asthma, and that "I'vee been intubated in [place], [place] been intubated in [place], get intubated wherever I go with asthma." Some family members have been sick with URI recently  HOSPITAL COURSE:   Acute asthma /? COPD exacerbation secondary to viral bronchitis Has hx of intubation. Reports she's working on getting nebs in [place]. negative flu pcr, respiratory viral panel as above, sputum culture no growth, urine strep pneumo antigen negative, legionella antigen pending, mrsa screen negative. Some improvement in the patient's wheezing, changed oral steroids to iv solumedrol 4/14, continue to taper steroids Avoided quinolones or macrolides due to prolonged Qtc. Continue doxycycline 7 more days. Initially treated with IV Solu-Medrol, switched to prednisone taper  Chronic diastolic CHF: Without exacerbation Only EF was in 2012 on LHC, documented here. euvolemic. normal BNP.  History of coronary disease and HTN:  Previous LHC x2 but no stent. -Continue home nifedipine, losartan and aspirin  Hyperglycemia ? Due to steroids no hx of DM. Monitor on SSI. A1C 10.2, initiated on lantus to 15 units bid while on iv steroids, patient will eventually need long-term insulin      Microcytic anemia:  H&h stable  QTc prolongation: -start tele, QTc improved from 505 ms to 459m, monitor      Discharge Exam:    Blood pressure 130/52, pulse 63, temperature 97.9 F (36.6 C), temperature source Axillary, resp. rate 18, height _0  (1.575 m), weight 76.5 kg (168 lb 10.4 oz), SpO2 96 %.   General: not in distress, coughing  HEENT: no pallor, moist mucosa  Cardiovascular: NS1&S2, no murmurs  Respiratory: diffuse wheezing b/l  Abdomen: soft, N,T ND,BS+  Musculoskeletal: warm, no edema    Follow-up Information    Follow up with PCP. Schedule an appointment as soon as possible for a visit in 1 week.   Why:  Hospital follow-up      Signed: AReyne Dumas4/18/2017, 10:23 AM        Time spent >45 mins

## 2016-03-06 NOTE — Care Management (Addendum)
Follow up appointment with DR Royce MacadamiaErnest Afflu  [address] , [place] , [place] [id number] , phone [phone]   Mar 24, 2016 at 0930   MD office aware home health is ordered and [place] MD will have to reorder and arrange .   Patient aware and voiced understanding.  Ronny FlurryHeather Aneudy Champlain RN BSN [phone]

## 2016-03-06 NOTE — Progress Notes (Signed)
Pt discharged home with daughter in stable condition 

## 2016-03-06 NOTE — Progress Notes (Signed)
Received a call from lab with positive meta pneumovirus

## 2016-03-06 NOTE — Progress Notes (Signed)
Pt laying in bed, responsive to voice. Denies c/o pain or difficulty breathing

## 2017-04-26 IMAGING — CR DG CHEST 2V
2 series · 2 of 2 positions shown · non-contrast
Comparison: 07/11/2011 and 05/10/2006.

CLINICAL DATA: Shortness of breath with cough for 2 or 3 days.
History of hypertension, COPD and congestive heart failure.

EXAM:
CHEST  2 VIEW

[chest pa]
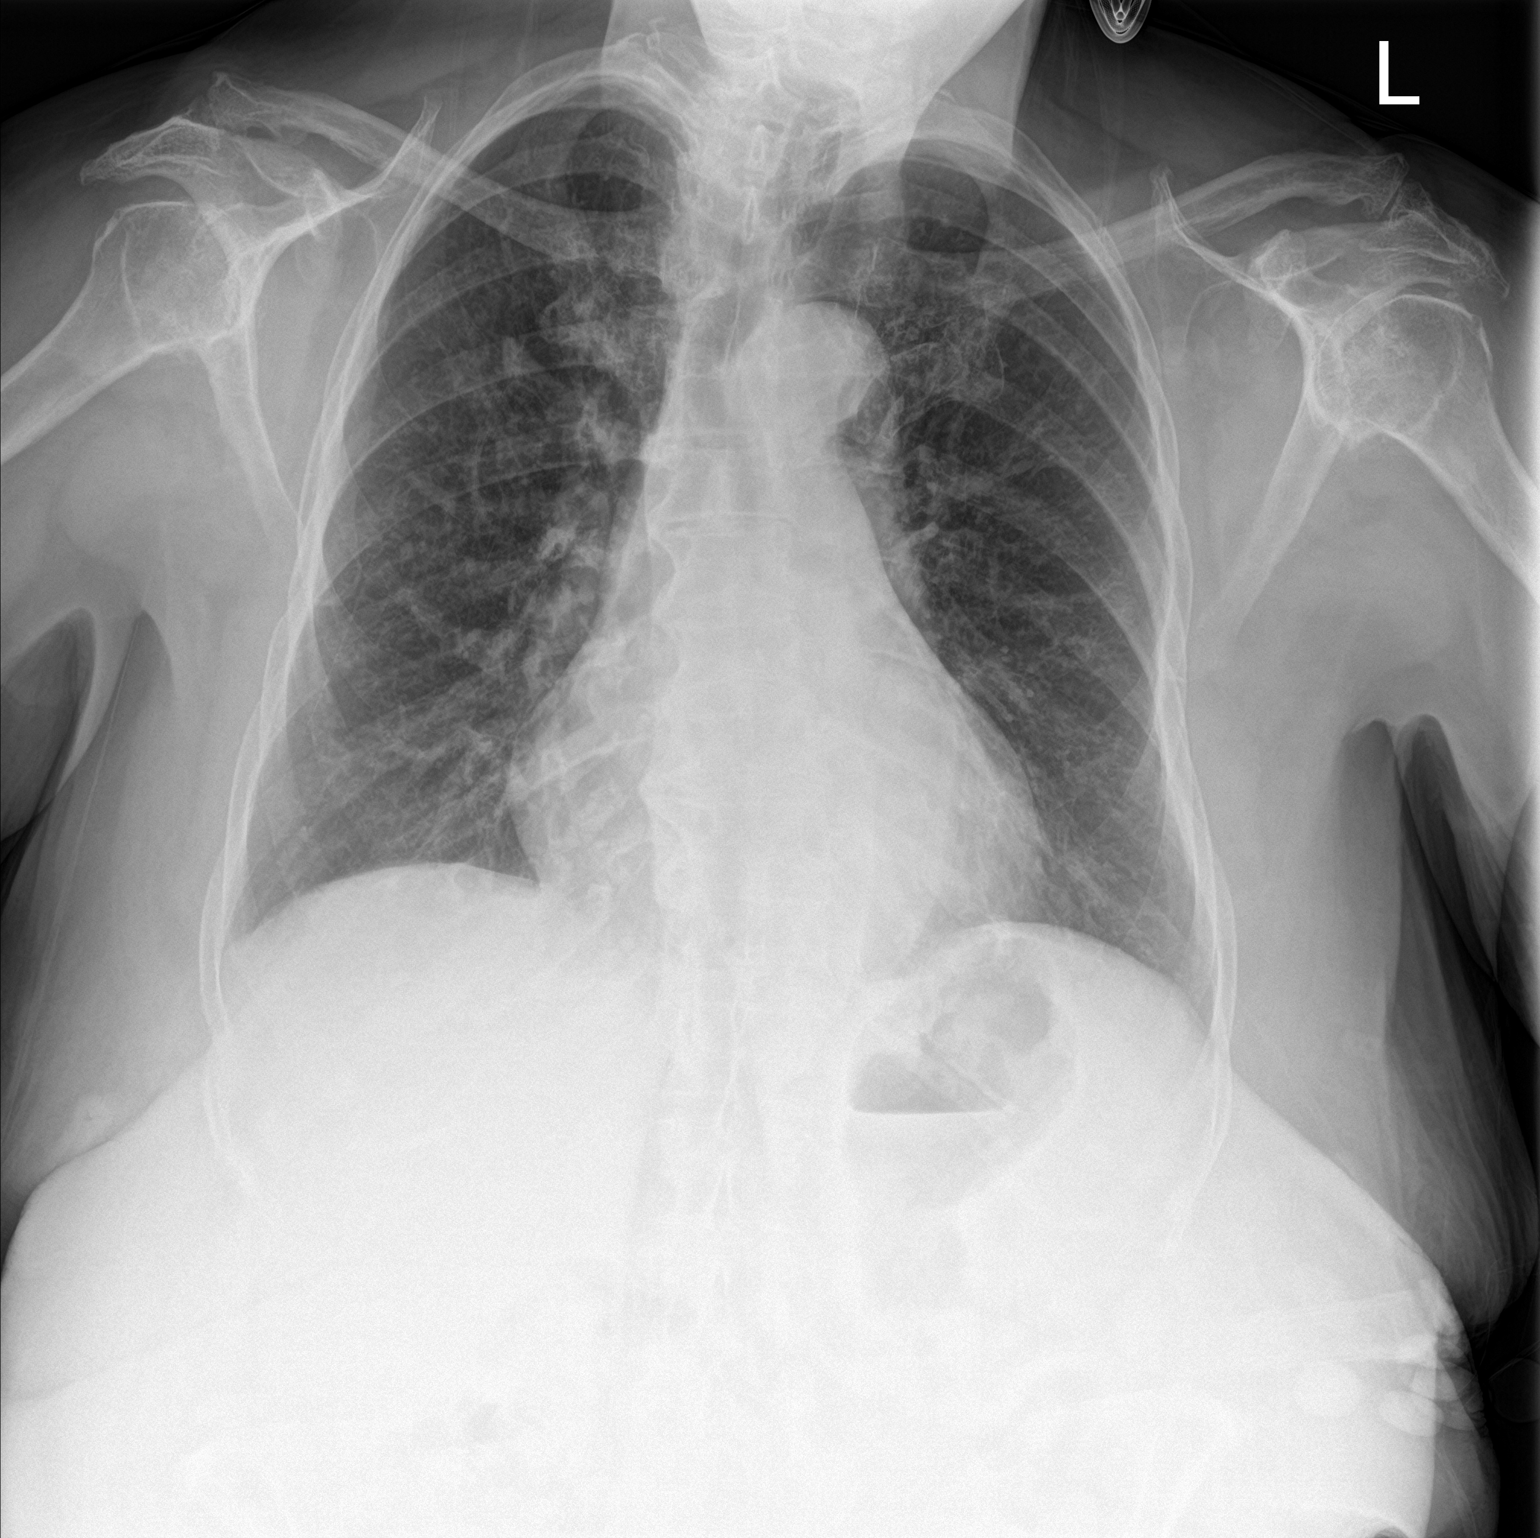

[chest lat]
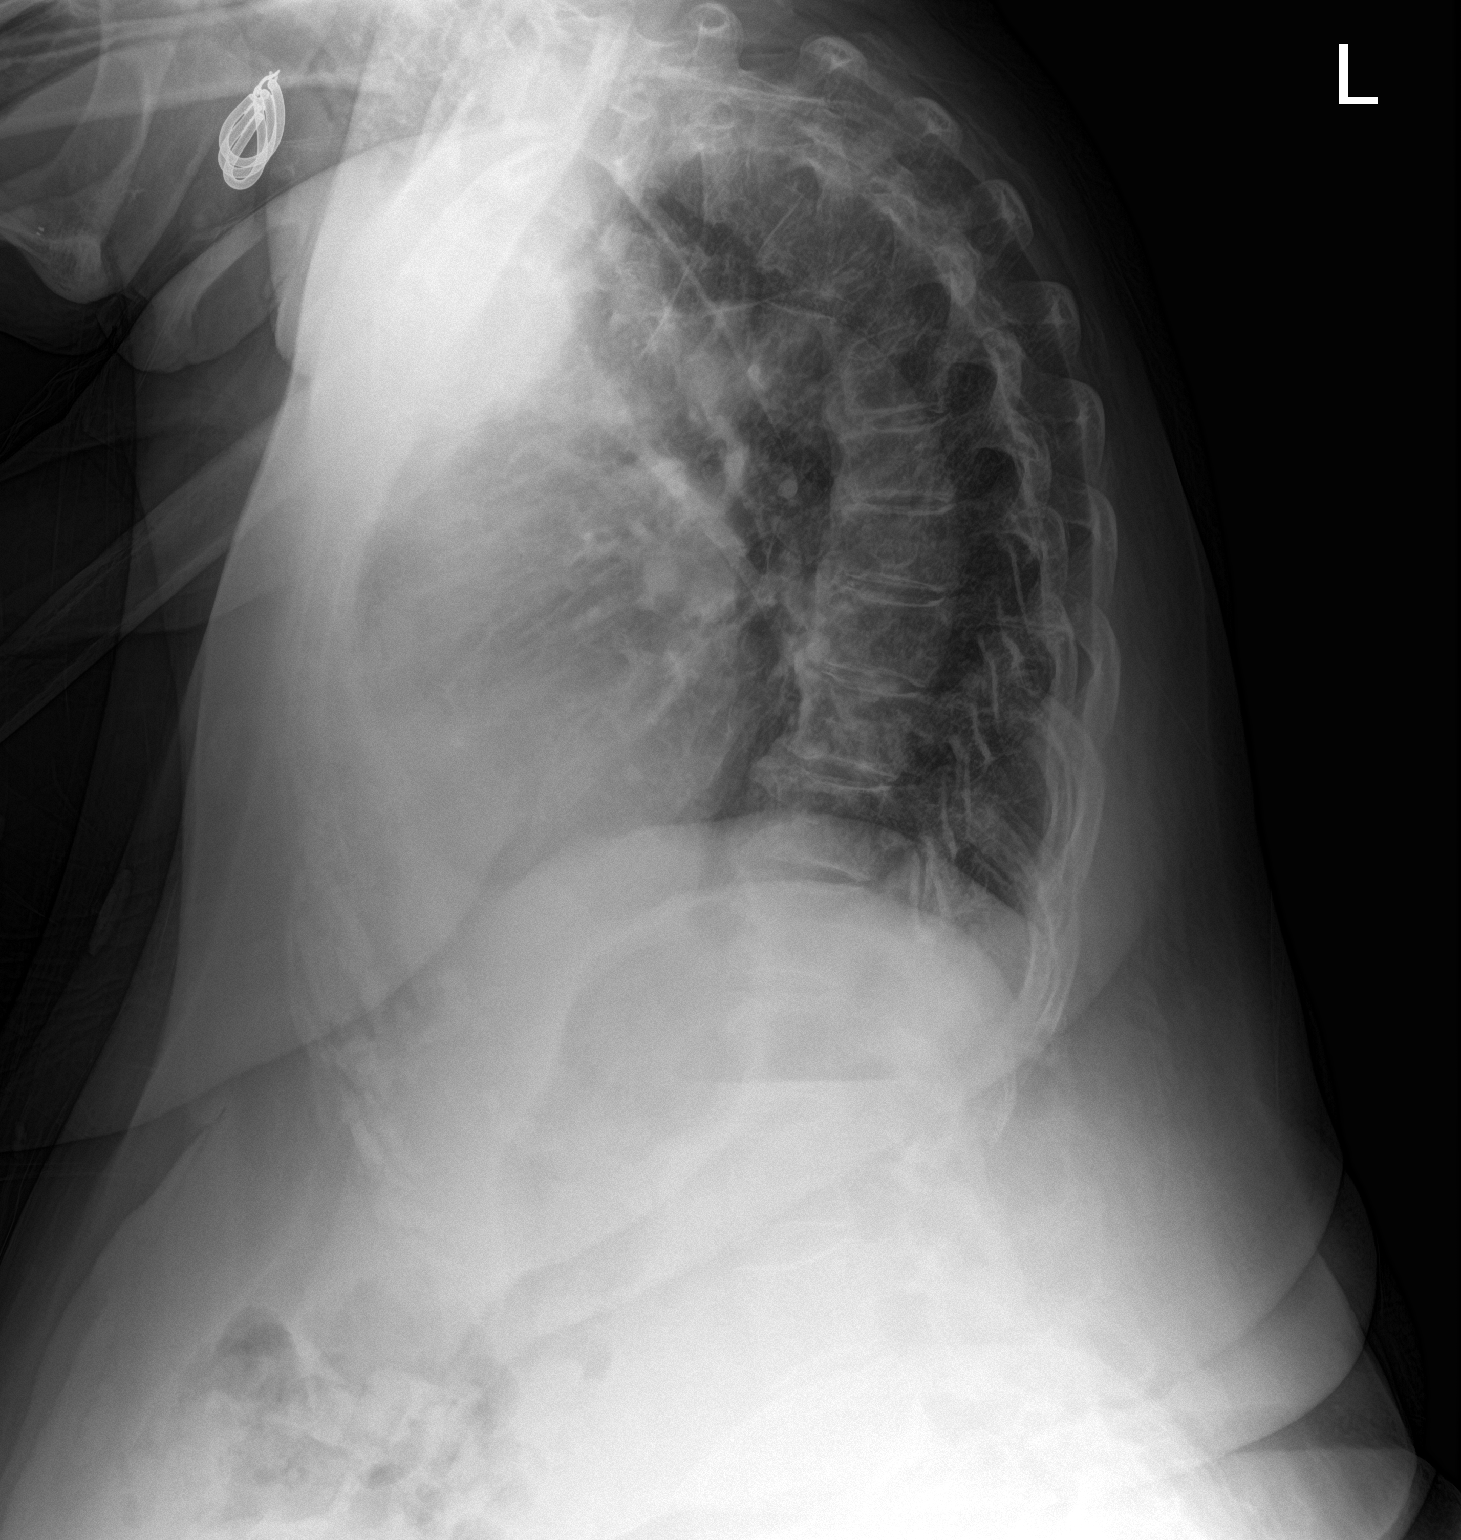

[2 of 2 positions shown; findings below may reference images not displayed]

FINDINGS: The heart size and mediastinal contours are stable. There is chronic
vascular congestion and mild atherosclerosis. No edema, confluent
airspace opacity or pleural effusion. No acute osseous findings are
seen. There are stable degenerative changes throughout the spine and
at both glenohumeral joints.
IMPRESSION: No acute cardiopulmonary process. Stable mild cardiomegaly and
chronic vascular congestion.

## 2017-04-30 IMAGING — CR DG CHEST 2V
2 series · 2 of 2 positions shown · non-contrast
Comparison: Chest x-ray 02/29/2016.

CLINICAL DATA: 74-year-old female with history of wheezing, asthma
and cough for the past several weeks.

EXAM:
CHEST  2 VIEW

[chest pa]
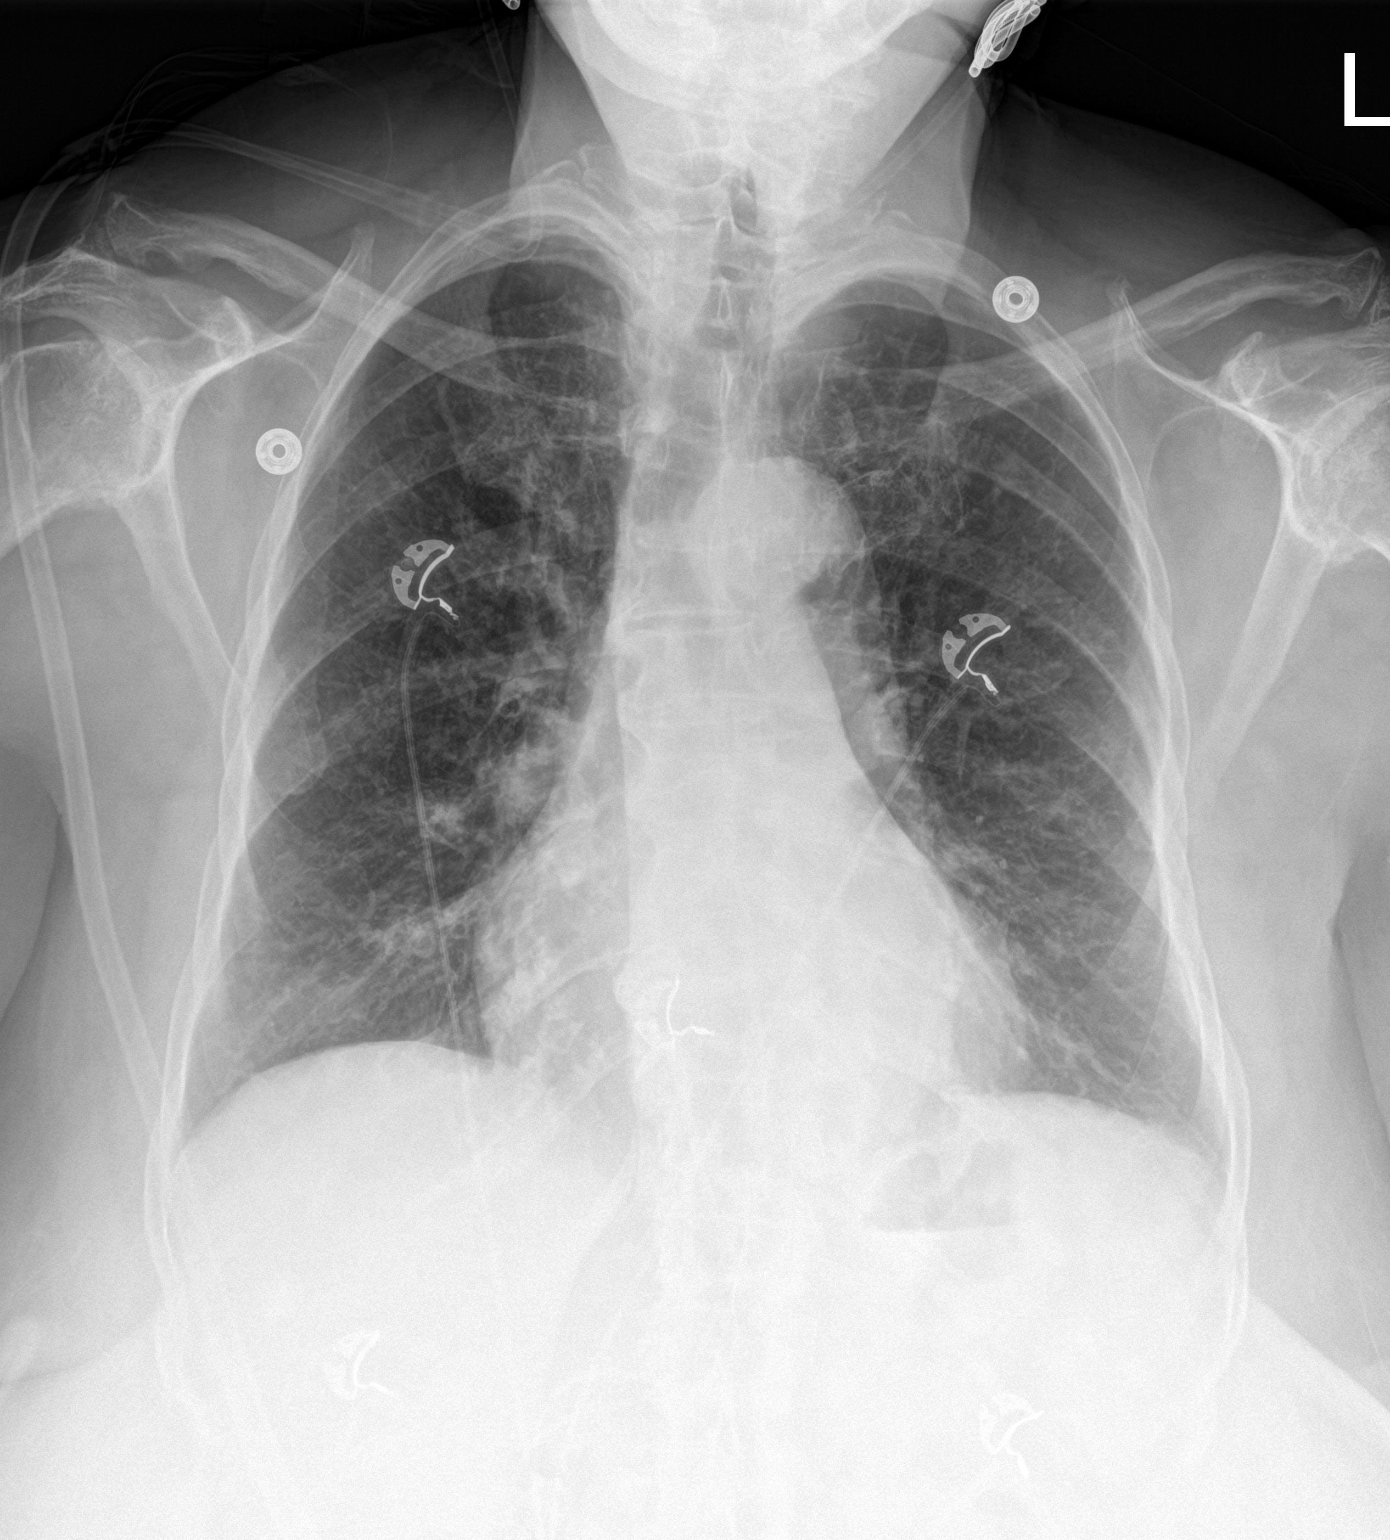

[chest lat]
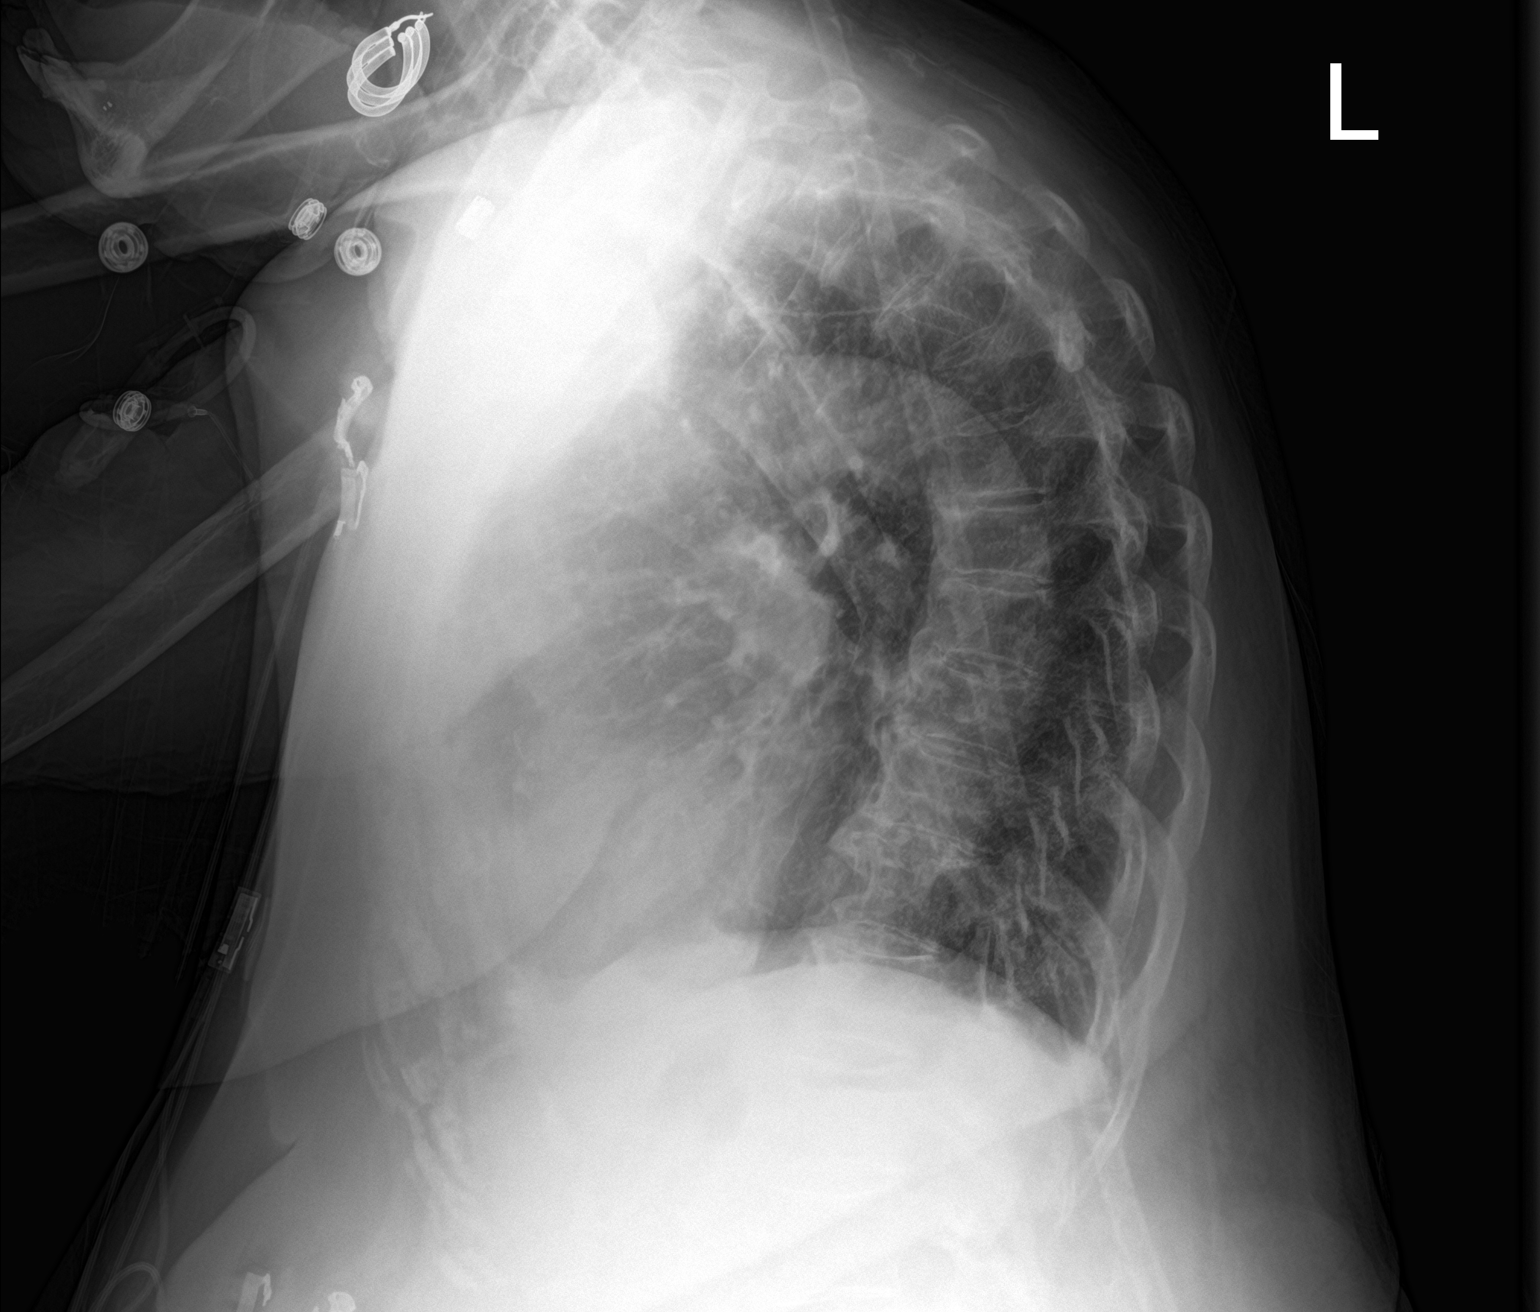

[2 of 2 positions shown; findings below may reference images not displayed]

FINDINGS: Mild diffuse peribronchial cuffing. Lung volumes are normal. No
consolidative airspace disease. No pleural effusions. No
pneumothorax. No pulmonary nodule or mass noted. Pulmonary
vasculature is normal. Heart size is mildly enlarged. Upper
mediastinal contours are within normal limits. Atherosclerosis in
the thoracic aorta.
IMPRESSION: 1. Mild diffuse peribronchial cuffing. This could be related to
reactive airway disease, or could indicate an acute bronchitis.
Clinical correlation is recommended. No hyperexpansion of the lungs
to suggest asthma exacerbation at this time.
2. Mild cardiomegaly.
3. Atherosclerosis.

## 2017-12-13 ENCOUNTER — Other Ambulatory Visit: Admit: 2017-12-13 | Payer: Medicare (Managed Care)
